# Patient Record
Sex: Female | Born: 1965
Health system: Southern US, Community
[De-identification: ages and names within clinical notes are randomized; demographics above are authoritative.]

## PROBLEM LIST (undated history)

## (undated) DIAGNOSIS — E039 Hypothyroidism, unspecified: Secondary | ICD-10-CM

## (undated) HISTORY — DX: Hypothyroidism, unspecified: E03.9

## (undated) HISTORY — PX: THYROIDECTOMY: SHX17

---

## 1997-11-18 ENCOUNTER — Encounter: Admission: RE | Admit: 1997-11-18 | Discharge: 1997-11-18 | Payer: Self-pay | Admitting: Family Medicine

## 1997-12-08 ENCOUNTER — Encounter: Admission: RE | Admit: 1997-12-08 | Discharge: 1997-12-08 | Payer: Self-pay | Admitting: Family Medicine

## 1997-12-14 ENCOUNTER — Encounter: Admission: RE | Admit: 1997-12-14 | Discharge: 1997-12-14 | Payer: Self-pay | Admitting: Family Medicine

## 1997-12-21 ENCOUNTER — Encounter: Admission: RE | Admit: 1997-12-21 | Discharge: 1997-12-21 | Payer: Self-pay | Admitting: Family Medicine

## 1998-01-04 ENCOUNTER — Encounter: Admission: RE | Admit: 1998-01-04 | Discharge: 1998-01-04 | Payer: Self-pay | Admitting: Family Medicine

## 1998-09-01 ENCOUNTER — Encounter: Admission: RE | Admit: 1998-09-01 | Discharge: 1998-09-01 | Payer: Self-pay | Admitting: Family Medicine

## 1998-09-07 ENCOUNTER — Ambulatory Visit (HOSPITAL_COMMUNITY): Admission: RE | Admit: 1998-09-07 | Discharge: 1998-09-07 | Payer: Self-pay | Admitting: *Deleted

## 1998-09-07 ENCOUNTER — Encounter: Admission: RE | Admit: 1998-09-07 | Discharge: 1998-09-07 | Payer: Self-pay | Admitting: Family Medicine

## 1998-09-15 ENCOUNTER — Encounter: Admission: RE | Admit: 1998-09-15 | Discharge: 1998-09-15 | Payer: Self-pay | Admitting: Family Medicine

## 1998-10-03 ENCOUNTER — Ambulatory Visit (HOSPITAL_COMMUNITY): Admission: RE | Admit: 1998-10-03 | Discharge: 1998-10-03 | Payer: Self-pay | Admitting: Family Medicine

## 1998-10-24 ENCOUNTER — Encounter: Admission: RE | Admit: 1998-10-24 | Discharge: 1998-10-24 | Payer: Self-pay | Admitting: Family Medicine

## 2000-02-09 ENCOUNTER — Encounter: Admission: RE | Admit: 2000-02-09 | Discharge: 2000-02-09 | Payer: Self-pay | Admitting: Family Medicine

## 2000-03-01 ENCOUNTER — Encounter: Admission: RE | Admit: 2000-03-01 | Discharge: 2000-03-01 | Payer: Self-pay | Admitting: Family Medicine

## 2000-11-29 ENCOUNTER — Encounter: Admission: RE | Admit: 2000-11-29 | Discharge: 2000-11-29 | Payer: Self-pay | Admitting: Family Medicine

## 2000-12-04 ENCOUNTER — Encounter: Admission: RE | Admit: 2000-12-04 | Discharge: 2000-12-04 | Payer: Self-pay | Admitting: Family Medicine

## 2001-02-19 ENCOUNTER — Encounter: Admission: RE | Admit: 2001-02-19 | Discharge: 2001-02-19 | Payer: Self-pay | Admitting: Family Medicine

## 2002-03-19 ENCOUNTER — Encounter: Admission: RE | Admit: 2002-03-19 | Discharge: 2002-03-19 | Payer: Self-pay | Admitting: Family Medicine

## 2003-06-23 ENCOUNTER — Encounter: Admission: RE | Admit: 2003-06-23 | Discharge: 2003-06-23 | Payer: Self-pay | Admitting: Family Medicine

## 2004-01-11 ENCOUNTER — Encounter: Admission: RE | Admit: 2004-01-11 | Discharge: 2004-01-11 | Payer: Self-pay | Admitting: Family Medicine

## 2004-05-09 ENCOUNTER — Other Ambulatory Visit: Admission: RE | Admit: 2004-05-09 | Discharge: 2004-05-09 | Payer: Self-pay | Admitting: Endocrinology

## 2004-05-09 ENCOUNTER — Ambulatory Visit: Payer: Self-pay | Admitting: Endocrinology

## 2004-05-16 ENCOUNTER — Ambulatory Visit: Payer: Self-pay | Admitting: Endocrinology

## 2004-05-22 ENCOUNTER — Encounter: Admission: RE | Admit: 2004-05-22 | Discharge: 2004-05-22 | Payer: Self-pay | Admitting: Endocrinology

## 2004-05-22 ENCOUNTER — Ambulatory Visit: Payer: Self-pay | Admitting: Endocrinology

## 2004-08-23 ENCOUNTER — Ambulatory Visit: Payer: Self-pay | Admitting: Endocrinology

## 2006-06-10 ENCOUNTER — Emergency Department (HOSPITAL_COMMUNITY): Admission: EM | Admit: 2006-06-10 | Discharge: 2006-06-10 | Payer: Self-pay | Admitting: Emergency Medicine

## 2011-08-31 ENCOUNTER — Other Ambulatory Visit (HOSPITAL_COMMUNITY): Payer: Self-pay | Admitting: Internal Medicine

## 2011-08-31 DIAGNOSIS — Z1231 Encounter for screening mammogram for malignant neoplasm of breast: Secondary | ICD-10-CM

## 2011-09-24 ENCOUNTER — Ambulatory Visit (HOSPITAL_COMMUNITY): Payer: Self-pay | Attending: Internal Medicine

## 2011-10-02 ENCOUNTER — Ambulatory Visit (HOSPITAL_COMMUNITY)
Admission: RE | Admit: 2011-10-02 | Discharge: 2011-10-02 | Disposition: A | Discharge status: Home / Self Care | Payer: Self-pay | Source: Ambulatory Visit | Attending: Internal Medicine | Admitting: Internal Medicine

## 2011-10-02 DIAGNOSIS — Z1231 Encounter for screening mammogram for malignant neoplasm of breast: Secondary | ICD-10-CM | POA: Insufficient documentation

## 2012-09-08 DIAGNOSIS — L905 Scar conditions and fibrosis of skin: Secondary | ICD-10-CM | POA: Insufficient documentation

## 2012-09-17 DIAGNOSIS — H60399 Other infective otitis externa, unspecified ear: Secondary | ICD-10-CM | POA: Insufficient documentation

## 2012-11-26 DIAGNOSIS — E042 Nontoxic multinodular goiter: Secondary | ICD-10-CM | POA: Insufficient documentation

## 2013-04-24 DIAGNOSIS — R001 Bradycardia, unspecified: Secondary | ICD-10-CM | POA: Insufficient documentation

## 2013-04-24 DIAGNOSIS — Z531 Procedure and treatment not carried out because of patient's decision for reasons of belief and group pressure: Secondary | ICD-10-CM | POA: Insufficient documentation

## 2013-08-03 DIAGNOSIS — E890 Postprocedural hypothyroidism: Secondary | ICD-10-CM | POA: Insufficient documentation

## 2013-09-03 DIAGNOSIS — R52 Pain, unspecified: Secondary | ICD-10-CM | POA: Insufficient documentation

## 2013-09-03 DIAGNOSIS — M76829 Posterior tibial tendinitis, unspecified leg: Secondary | ICD-10-CM | POA: Insufficient documentation

## 2016-11-30 DIAGNOSIS — E890 Postprocedural hypothyroidism: Secondary | ICD-10-CM | POA: Diagnosis not present

## 2016-11-30 DIAGNOSIS — E042 Nontoxic multinodular goiter: Secondary | ICD-10-CM | POA: Diagnosis not present

## 2016-11-30 DIAGNOSIS — R5383 Other fatigue: Secondary | ICD-10-CM | POA: Insufficient documentation

## 2017-03-21 DIAGNOSIS — R001 Bradycardia, unspecified: Secondary | ICD-10-CM | POA: Diagnosis not present

## 2017-03-21 DIAGNOSIS — I10 Essential (primary) hypertension: Secondary | ICD-10-CM | POA: Diagnosis not present

## 2017-03-21 DIAGNOSIS — R253 Fasciculation: Secondary | ICD-10-CM | POA: Diagnosis not present

## 2017-03-21 DIAGNOSIS — R002 Palpitations: Secondary | ICD-10-CM | POA: Diagnosis not present

## 2017-03-21 DIAGNOSIS — E039 Hypothyroidism, unspecified: Secondary | ICD-10-CM | POA: Diagnosis not present

## 2017-03-21 DIAGNOSIS — E0590 Thyrotoxicosis, unspecified without thyrotoxic crisis or storm: Secondary | ICD-10-CM | POA: Diagnosis not present

## 2017-04-05 DIAGNOSIS — E042 Nontoxic multinodular goiter: Secondary | ICD-10-CM | POA: Diagnosis not present

## 2017-04-05 DIAGNOSIS — R5383 Other fatigue: Secondary | ICD-10-CM | POA: Diagnosis not present

## 2017-04-05 DIAGNOSIS — Z0000 Encounter for general adult medical examination without abnormal findings: Secondary | ICD-10-CM | POA: Diagnosis not present

## 2017-04-05 DIAGNOSIS — Z0001 Encounter for general adult medical examination with abnormal findings: Secondary | ICD-10-CM | POA: Diagnosis not present

## 2017-04-05 DIAGNOSIS — Z6835 Body mass index (BMI) 35.0-35.9, adult: Secondary | ICD-10-CM | POA: Diagnosis not present

## 2017-04-05 DIAGNOSIS — I10 Essential (primary) hypertension: Secondary | ICD-10-CM | POA: Diagnosis not present

## 2017-04-05 DIAGNOSIS — E669 Obesity, unspecified: Secondary | ICD-10-CM | POA: Diagnosis not present

## 2017-04-23 DIAGNOSIS — J209 Acute bronchitis, unspecified: Secondary | ICD-10-CM | POA: Diagnosis not present

## 2017-04-23 DIAGNOSIS — J069 Acute upper respiratory infection, unspecified: Secondary | ICD-10-CM | POA: Diagnosis not present

## 2017-06-13 DIAGNOSIS — M7582 Other shoulder lesions, left shoulder: Secondary | ICD-10-CM | POA: Diagnosis not present

## 2017-06-21 DIAGNOSIS — M7501 Adhesive capsulitis of right shoulder: Secondary | ICD-10-CM | POA: Diagnosis not present

## 2017-08-02 DIAGNOSIS — R531 Weakness: Secondary | ICD-10-CM | POA: Diagnosis not present

## 2017-08-02 DIAGNOSIS — M25512 Pain in left shoulder: Secondary | ICD-10-CM | POA: Diagnosis not present

## 2017-08-02 DIAGNOSIS — M7502 Adhesive capsulitis of left shoulder: Secondary | ICD-10-CM | POA: Diagnosis not present

## 2017-08-02 DIAGNOSIS — M25612 Stiffness of left shoulder, not elsewhere classified: Secondary | ICD-10-CM | POA: Diagnosis not present

## 2017-08-08 DIAGNOSIS — Z23 Encounter for immunization: Secondary | ICD-10-CM | POA: Diagnosis not present

## 2017-08-23 DIAGNOSIS — M25571 Pain in right ankle and joints of right foot: Secondary | ICD-10-CM | POA: Diagnosis not present

## 2017-09-09 DIAGNOSIS — Z23 Encounter for immunization: Secondary | ICD-10-CM | POA: Diagnosis not present

## 2017-09-20 DIAGNOSIS — M25571 Pain in right ankle and joints of right foot: Secondary | ICD-10-CM | POA: Insufficient documentation

## 2017-09-20 DIAGNOSIS — S93491D Sprain of other ligament of right ankle, subsequent encounter: Secondary | ICD-10-CM | POA: Diagnosis not present

## 2017-10-04 DIAGNOSIS — I10 Essential (primary) hypertension: Secondary | ICD-10-CM | POA: Diagnosis not present

## 2017-10-04 DIAGNOSIS — E538 Deficiency of other specified B group vitamins: Secondary | ICD-10-CM | POA: Diagnosis not present

## 2017-10-04 DIAGNOSIS — R5383 Other fatigue: Secondary | ICD-10-CM | POA: Diagnosis not present

## 2017-11-06 DIAGNOSIS — E538 Deficiency of other specified B group vitamins: Secondary | ICD-10-CM | POA: Diagnosis not present

## 2017-12-16 DIAGNOSIS — E538 Deficiency of other specified B group vitamins: Secondary | ICD-10-CM | POA: Diagnosis not present

## 2018-01-21 DIAGNOSIS — E538 Deficiency of other specified B group vitamins: Secondary | ICD-10-CM | POA: Diagnosis not present

## 2018-02-07 DIAGNOSIS — R5383 Other fatigue: Secondary | ICD-10-CM | POA: Diagnosis not present

## 2018-02-07 DIAGNOSIS — E749 Disorder of carbohydrate metabolism, unspecified: Secondary | ICD-10-CM | POA: Diagnosis not present

## 2018-02-07 DIAGNOSIS — E890 Postprocedural hypothyroidism: Secondary | ICD-10-CM | POA: Diagnosis not present

## 2018-03-12 DIAGNOSIS — L821 Other seborrheic keratosis: Secondary | ICD-10-CM | POA: Diagnosis not present

## 2018-03-12 DIAGNOSIS — L700 Acne vulgaris: Secondary | ICD-10-CM | POA: Diagnosis not present

## 2018-03-12 DIAGNOSIS — L219 Seborrheic dermatitis, unspecified: Secondary | ICD-10-CM | POA: Diagnosis not present

## 2018-03-12 DIAGNOSIS — L819 Disorder of pigmentation, unspecified: Secondary | ICD-10-CM | POA: Diagnosis not present

## 2018-03-18 ENCOUNTER — Telehealth: Payer: Self-pay

## 2018-03-18 NOTE — Telephone Encounter (Signed)
Copied from CRM [id number]. Topic: Appointment Scheduling - New Patient >> Mar 17, 2018  3:17 PM Gaynelle AduPoole, Shalonda wrote: Selena BattenKim day is requesting to be a new patient for Dr.Lowne-chase. Her mother Nicole CellaDorothy Vieyra sees Dr.Lowne and she is hoping that since her mother referred her she can become a new patient. >> Mar 18, 2018  9:30 AM Gerrianne ScalePayne, Angela L wrote: Pt calling back to see about an appt with Dr Zola ButtonLowne-Chase for a NP appointment call her back at [phone] lmom if no answer

## 2018-03-18 NOTE — Telephone Encounter (Signed)
Please advise 

## 2018-03-18 NOTE — Telephone Encounter (Signed)
Okay to schedule NP appt at their convenience.  

## 2018-03-18 NOTE — Telephone Encounter (Signed)
yes

## 2018-03-19 NOTE — Telephone Encounter (Signed)
Appt scheduled 03/21/2018.

## 2018-03-21 ENCOUNTER — Ambulatory Visit (HOSPITAL_BASED_OUTPATIENT_CLINIC_OR_DEPARTMENT_OTHER)
Admission: RE | Admit: 2018-03-21 | Discharge: 2018-03-21 | Disposition: A | Discharge status: Home / Self Care | Payer: BLUE CROSS/BLUE SHIELD | Source: Ambulatory Visit | Attending: Family Medicine | Admitting: Family Medicine

## 2018-03-21 ENCOUNTER — Ambulatory Visit: Payer: BLUE CROSS/BLUE SHIELD | Admitting: Family Medicine

## 2018-03-21 ENCOUNTER — Encounter: Payer: Self-pay | Admitting: Family Medicine

## 2018-03-21 VITALS — BP 126/73 | HR 68 | Temp 98.1°F | Resp 16 | Ht 65.0 in | Wt 226.0 lb

## 2018-03-21 DIAGNOSIS — E890 Postprocedural hypothyroidism: Secondary | ICD-10-CM | POA: Diagnosis not present

## 2018-03-21 DIAGNOSIS — R0609 Other forms of dyspnea: Secondary | ICD-10-CM | POA: Diagnosis not present

## 2018-03-21 DIAGNOSIS — R600 Localized edema: Secondary | ICD-10-CM | POA: Diagnosis not present

## 2018-03-21 DIAGNOSIS — R5383 Other fatigue: Secondary | ICD-10-CM

## 2018-03-21 DIAGNOSIS — I10 Essential (primary) hypertension: Secondary | ICD-10-CM

## 2018-03-21 DIAGNOSIS — R0602 Shortness of breath: Secondary | ICD-10-CM | POA: Diagnosis not present

## 2018-03-21 MED ORDER — HYDROCHLOROTHIAZIDE 25 MG PO TABS: 25.0000 mg | ORAL_TABLET | Freq: Every day | ORAL | 3 refills | Status: DC

## 2018-03-21 NOTE — Patient Instructions (Signed)
DASH Eating Plan DASH stands for "Dietary Approaches to Stop Hypertension." The DASH eating plan is a healthy eating plan that has been shown to reduce high blood pressure (hypertension). It may also reduce your risk for type 2 diabetes, heart disease, and stroke. The DASH eating plan may also help with weight loss. What are tips for following this plan? General guidelines  Avoid eating more than 2,300 mg (milligrams) of salt (sodium) a day. If you have hypertension, you may need to reduce your sodium intake to 1,500 mg a day.  Limit alcohol intake to no more than 1 drink a day for nonpregnant women and 2 drinks a day for men. One drink equals 12 oz of beer, 5 oz of wine, or 1 oz of hard liquor.  Work with your health care provider to maintain a healthy body weight or to lose weight. Ask what an ideal weight is for you.  Get at least 30 minutes of exercise that causes your heart to beat faster (aerobic exercise) most [place] of the week. Activities may include walking, swimming, or biking.  Work with your health care provider or diet and nutrition specialist (dietitian) to adjust your eating plan to your individual calorie needs. Reading food labels  Check food labels for the amount of sodium per serving. Choose foods with less than 5 percent of the Daily Value of sodium. Generally, foods with less than 300 mg of sodium per serving fit into this eating plan.  To find whole grains, look for the word "whole" as the first word in the ingredient list. Shopping  Buy products labeled as "low-sodium" or "no salt added."  Buy fresh foods. Avoid canned foods and premade or frozen meals. Cooking  Avoid adding salt when cooking. Use salt-free seasonings or herbs instead of table salt or sea salt. Check with your health care provider or pharmacist before using salt substitutes.  Do not fry foods. Cook foods using healthy methods such as baking, boiling, grilling, and broiling instead.  Cook with  heart-healthy oils, such as olive, canola, soybean, or sunflower oil. Meal planning   Eat a balanced diet that includes: ? 5 or more servings of fruits and vegetables each day. At each meal, try to fill half of your plate with fruits and vegetables. ? Up to 6-8 servings of whole grains each day. ? Less than 6 oz of lean meat, poultry, or fish each day. A 3-oz serving of meat is about the same size as a deck of cards. One egg equals 1 oz. ? 2 servings of low-fat dairy each day. ? A serving of nuts, seeds, or beans 5 times each week. ? Heart-healthy fats. Healthy fats called Omega-3 fatty acids are found in foods such as flaxseeds and coldwater fish, like sardines, salmon, and mackerel.  Limit how much you eat of the following: ? Canned or prepackaged foods. ? Food that is high in trans fat, such as fried foods. ? Food that is high in saturated fat, such as fatty meat. ? Sweets, desserts, sugary drinks, and other foods with added sugar. ? Full-fat dairy products.  Do not salt foods before eating.  Try to eat at least 2 vegetarian meals each week.  Eat more home-cooked food and less restaurant, buffet, and fast food.  When eating at a restaurant, ask that your food be prepared with less salt or no salt, if possible. What foods are recommended? The items listed may not be a complete list. Talk with your dietitian about what   dietary choices are best for you. Grains Whole-grain or whole-wheat bread. Whole-grain or whole-wheat pasta. Brown rice. Oatmeal. Quinoa. Bulgur. Whole-grain and low-sodium cereals. Pita bread. Low-fat, low-sodium crackers. Whole-wheat flour tortillas. Vegetables Fresh or frozen vegetables (raw, steamed, roasted, or grilled). Low-sodium or reduced-sodium tomato and vegetable juice. Low-sodium or reduced-sodium tomato sauce and tomato paste. Low-sodium or reduced-sodium canned vegetables. Fruits All fresh, dried, or frozen fruit. Canned fruit in natural juice (without  added sugar). Meat and other protein foods Skinless chicken or turkey. Ground chicken or turkey. Pork with fat trimmed off. Fish and seafood. Egg whites. Dried beans, peas, or lentils. Unsalted nuts, nut butters, and seeds. Unsalted canned beans. Lean cuts of beef with fat trimmed off. Low-sodium, lean deli meat. Dairy Low-fat (1%) or fat-free (skim) milk. Fat-free, low-fat, or reduced-fat cheeses. Nonfat, low-sodium ricotta or cottage cheese. Low-fat or nonfat yogurt. Low-fat, low-sodium cheese. Fats and oils Soft margarine without trans fats. Vegetable oil. Low-fat, reduced-fat, or light mayonnaise and salad dressings (reduced-sodium). Canola, safflower, olive, soybean, and sunflower oils. Avocado. Seasoning and other foods Herbs. Spices. Seasoning mixes without salt. Unsalted popcorn and pretzels. Fat-free sweets. What foods are not recommended? The items listed may not be a complete list. Talk with your dietitian about what dietary choices are best for you. Grains Baked goods made with fat, such as croissants, muffins, or some breads. Dry pasta or rice meal packs. Vegetables Creamed or fried vegetables. Vegetables in a cheese sauce. Regular canned vegetables (not low-sodium or reduced-sodium). Regular canned tomato sauce and paste (not low-sodium or reduced-sodium). Regular tomato and vegetable juice (not low-sodium or reduced-sodium). Pickles. Olives. Fruits Canned fruit in a light or heavy syrup. Fried fruit. Fruit in cream or butter sauce. Meat and other protein foods Fatty cuts of meat. Ribs. Fried meat. Bacon. Sausage. Bologna and other processed lunch meats. Salami. Fatback. Hotdogs. Bratwurst. Salted nuts and seeds. Canned beans with added salt. Canned or smoked fish. Whole eggs or egg yolks. Chicken or turkey with skin. Dairy Whole or 2% milk, cream, and half-and-half. Whole or full-fat cream cheese. Whole-fat or sweetened yogurt. Full-fat cheese. Nondairy creamers. Whipped toppings.  Processed cheese and cheese spreads. Fats and oils Butter. Stick margarine. Lard. Shortening. Ghee. Bacon fat. Tropical oils, such as coconut, palm kernel, or palm oil. Seasoning and other foods Salted popcorn and pretzels. Onion salt, garlic salt, seasoned salt, table salt, and sea salt. Worcestershire sauce. Tartar sauce. Barbecue sauce. Teriyaki sauce. Soy sauce, including reduced-sodium. Steak sauce. Canned and packaged gravies. Fish sauce. Oyster sauce. Cocktail sauce. Horseradish that you find on the shelf. Ketchup. Mustard. Meat flavorings and tenderizers. Bouillon cubes. Hot sauce and Tabasco sauce. Premade or packaged marinades. Premade or packaged taco seasonings. Relishes. Regular salad dressings. Where to find more information:  National Heart, Lung, and Blood Institute: www.nhlbi.nih.gov  American Heart Association: www.heart.org Summary  The DASH eating plan is a healthy eating plan that has been shown to reduce high blood pressure (hypertension). It may also reduce your risk for type 2 diabetes, heart disease, and stroke.  With the DASH eating plan, you should limit salt (sodium) intake to 2,300 mg a day. If you have hypertension, you may need to reduce your sodium intake to 1,500 mg a day.  When on the DASH eating plan, aim to eat more fresh fruits and vegetables, whole grains, lean proteins, low-fat dairy, and heart-healthy fats.  Work with your health care provider or diet and nutrition specialist (dietitian) to adjust your eating plan to your individual   calorie needs. This information is not intended to replace advice given to you by your health care provider. Make sure you discuss any questions you have with your health care provider. Document Released: 05/31/2011 Document Revised: 06/04/2016 Document Reviewed: 06/04/2016 Elsevier Interactive Patient Education  2018 Elsevier Inc.  

## 2018-03-21 NOTE — Progress Notes (Signed)
Patient ID: Lauren Jones, female    DOB: 1965-08-07  Age: 52 y.o. MRN: [id number]    Subjective:  Subjective  HPI Lauren Jones presents to establish and c/o low ext edema since her thyroid was removed in 2014   Worse in L low ext.  Pt also c/o sob , no chest pain   Review of Systems  Constitutional: Negative for appetite change, diaphoresis, fatigue and unexpected weight change.  Eyes: Negative for pain, redness and visual disturbance.  Respiratory: Positive for shortness of breath. Negative for cough, chest tightness and wheezing.   Cardiovascular: Positive for leg swelling. Negative for chest pain and palpitations.  Endocrine: Negative for cold intolerance, heat intolerance, polydipsia, polyphagia and polyuria.  Genitourinary: Negative for difficulty urinating, dysuria and frequency.  Neurological: Negative for dizziness, light-headedness, numbness and headaches.    History Past Medical History:  Diagnosis Date  . Hypothyroidism     She has a past surgical history that includes Thyroidectomy (Bilateral, 2014-2015?).   Her family history includes Colon cancer in her paternal grandmother; Hypertension in her father and mother; Sarcoidosis in her father.She reports that she has quit smoking. Her smoking use included cigarettes. She has never used smokeless tobacco. She reports that she drinks alcohol. She reports that she does not use drugs.  No current outpatient medications on file prior to visit.   No current facility-administered medications on file prior to visit.      Objective:  Objective  Physical Exam  Constitutional: She is oriented to person, place, and time. She appears well-developed and well-nourished.  HENT:  Head: Normocephalic and atraumatic.  Eyes: Conjunctivae and EOM are normal.  Neck: Normal range of motion. Neck supple. No JVD present. Carotid bruit is not present. No thyromegaly present.  Cardiovascular: Normal rate, regular rhythm and normal heart sounds.  No  murmur heard. Pulmonary/Chest: Effort normal and breath sounds normal. No respiratory distress. She has no wheezes. She has no rales. She exhibits no tenderness.  Musculoskeletal: She exhibits no edema.  Neurological: She is alert and oriented to person, place, and time.  Psychiatric: She has a normal mood and affect.  Nursing note and vitals reviewed.  BP 126/73 (BP Location: Left Arm, Cuff Size: Large)   Pulse 68   Temp 98.1 F (36.7 C) (Oral)   Resp 16   Ht 5\' 5"  (1.651 m)   Wt 226 lb (102.5 kg)   SpO2 98%   BMI 37.61 kg/m  Wt Readings from Last 3 Encounters:  03/21/18 226 lb (102.5 kg)     Lab Results  Component Value Date   WBC 6.3 03/24/2018   HGB 11.6 (L) 03/24/2018   HCT 35.8 03/24/2018   PLT 315 03/24/2018   GLUCOSE 99 03/24/2018   CHOL 168 03/24/2018   TRIG 119 03/24/2018   HDL 43 (L) 03/24/2018   LDLCALC 103 (H) 03/24/2018   ALT 14 03/24/2018   AST 16 03/24/2018   NA 140 03/24/2018   K 4.0 03/24/2018   CL 103 03/24/2018   CREATININE 0.93 03/24/2018   BUN 9 03/24/2018   CO2 30 03/24/2018   TSH 5.54 (H) 03/24/2018   HGBA1C 5.8 (H) 03/24/2018    Ms Digital Screening  Result Date: 10/03/2011 DG [facility] MAMMOGRAM Bilateral CC and MLO view(s) were taken. Technologist: Rebecca Eaton, R.T.(R)(M) DIGITAL SCREENING MAMMOGRAM WITH CAD: There are scattered fibroglandular densities.  No masses or malignant type calcifications are identified. Images were processed with CAD. IMPRESSION: No specific mammographic evidence of malignancy.  Next  screening mammogram is recommended in one year. A result letter of this screening mammogram will be mailed directly to the patient. ASSESSMENT: Negative - BI-RADS 1 Screening mammogram in 1 year. ,    Assessment & Plan:  Plan  I am having Lauren Jones start on hydrochlorothiazide.  Meds ordered this encounter  Medications  . hydrochlorothiazide (HYDRODIURIL) 25 MG tablet    Sig: Take 1 tablet (25 mg total) by mouth daily.     Dispense:  90 tablet    Refill:  3    Problem List Items Addressed This Visit      Unprioritized   Essential hypertension    Poorly controlled will alter medications, encouraged DASH diet, minimize caffeine and obtain adequate sleep. Report concerning symptoms and follow up as directed and as needed       Relevant Medications   hydrochlorothiazide (HYDRODIURIL) 25 MG tablet   Fatigue   Relevant Orders   Lipid panel (Completed)   CBC with Differential/Platelet (Completed)   Hemoglobin A1c (Completed)   Vitamin B12 (Completed)   Thyroid Panel With TSH (Completed)   Vitamin D (25 hydroxy) (Completed)   Comprehensive metabolic panel (Completed)   Lower extremity edema    Elevate legs Diuretic per orders       Relevant Medications   hydrochlorothiazide (HYDRODIURIL) 25 MG tablet   Other Relevant Orders   EKG 12-Lead (Completed)   ECHOCARDIOGRAM COMPLETE   DG Chest 2 View (Completed)   Lipid panel (Completed)   CBC with Differential/Platelet (Completed)   Hemoglobin A1c (Completed)   Vitamin B12 (Completed)   Thyroid Panel With TSH (Completed)   Vitamin D (25 hydroxy) (Completed)   Comprehensive metabolic panel (Completed)    Other Visit Diagnoses    Postoperative hypothyroidism    -  Primary   Relevant Orders   Thyroid Panel With TSH (Completed)   DOE (dyspnea on exertion)       Relevant Orders   EKG 12-Lead (Completed)   ECHOCARDIOGRAM COMPLETE   DG Chest 2 View (Completed)   Lipid panel (Completed)   CBC with Differential/Platelet (Completed)   Hemoglobin A1c (Completed)   Vitamin B12 (Completed)   Thyroid Panel With TSH (Completed)   Vitamin D (25 hydroxy) (Completed)   Comprehensive metabolic panel (Completed)      Follow-up: Return in about 3 weeks (around 04/11/2018) for hypertension.  Donato Schultz, DO

## 2018-03-24 ENCOUNTER — Other Ambulatory Visit (INDEPENDENT_AMBULATORY_CARE_PROVIDER_SITE_OTHER): Payer: BLUE CROSS/BLUE SHIELD

## 2018-03-24 ENCOUNTER — Other Ambulatory Visit: Payer: BLUE CROSS/BLUE SHIELD

## 2018-03-24 DIAGNOSIS — E890 Postprocedural hypothyroidism: Secondary | ICD-10-CM | POA: Diagnosis not present

## 2018-03-24 DIAGNOSIS — R5383 Other fatigue: Secondary | ICD-10-CM | POA: Diagnosis not present

## 2018-03-24 DIAGNOSIS — R600 Localized edema: Secondary | ICD-10-CM | POA: Diagnosis not present

## 2018-03-24 DIAGNOSIS — R0609 Other forms of dyspnea: Secondary | ICD-10-CM | POA: Diagnosis not present

## 2018-03-25 ENCOUNTER — Encounter: Payer: Self-pay | Admitting: Family Medicine

## 2018-03-25 DIAGNOSIS — R600 Localized edema: Secondary | ICD-10-CM | POA: Insufficient documentation

## 2018-03-25 DIAGNOSIS — I10 Essential (primary) hypertension: Secondary | ICD-10-CM | POA: Insufficient documentation

## 2018-03-25 LAB — CBC WITH DIFFERENTIAL/PLATELET
BASOS ABS: 69 {cells}/uL (ref 0–200)
Basophils Relative: 1.1 %
EOS PCT: 3 %
Eosinophils Absolute: 189 cells/uL (ref 15–500)
HCT: 35.8 % (ref 35.0–45.0)
HEMOGLOBIN: 11.6 g/dL — AB (ref 11.7–15.5)
Lymphs Abs: 2255 cells/uL (ref 850–3900)
MCH: 26.2 pg — ABNORMAL LOW (ref 27.0–33.0)
MCHC: 32.4 g/dL (ref 32.0–36.0)
MCV: 81 fL (ref 80.0–100.0)
MPV: 10.8 fL (ref 7.5–12.5)
Monocytes Relative: 9.4 %
NEUTROS ABS: 3194 {cells}/uL (ref 1500–7800)
Neutrophils Relative %: 50.7 %
Platelets: 315 10*3/uL (ref 140–400)
RBC: 4.42 10*6/uL (ref 3.80–5.10)
RDW: 14.5 % (ref 11.0–15.0)
Total Lymphocyte: 35.8 %
WBC mixed population: 592 cells/uL (ref 200–950)
WBC: 6.3 10*3/uL (ref 3.8–10.8)

## 2018-03-25 LAB — LIPID PANEL
CHOLESTEROL: 168 mg/dL (ref <200)
HDL: 43 mg/dL — AB (ref >50)
LDL Cholesterol (Calc): 103 mg/dL (calc) — ABNORMAL HIGH
Non-HDL Cholesterol (Calc): 125 mg/dL (calc) (ref <130)
Total CHOL/HDL Ratio: 3.9 (calc) (ref <5.0)
Triglycerides: 119 mg/dL (ref <150)

## 2018-03-25 LAB — COMPREHENSIVE METABOLIC PANEL
AG RATIO: 1.4 (calc) (ref 1.0–2.5)
ALKALINE PHOSPHATASE (APISO): 66 U/L (ref 33–130)
ALT: 14 U/L (ref 6–29)
AST: 16 U/L (ref 10–35)
Albumin: 3.8 g/dL (ref 3.6–5.1)
BILIRUBIN TOTAL: 0.5 mg/dL (ref 0.2–1.2)
BUN: 9 mg/dL (ref 7–25)
CALCIUM: 8.7 mg/dL (ref 8.6–10.4)
CO2: 30 mmol/L (ref 20–32)
Chloride: 103 mmol/L (ref 98–110)
Creat: 0.93 mg/dL (ref 0.50–1.05)
Globulin: 2.7 g/dL (calc) (ref 1.9–3.7)
Glucose, Bld: 99 mg/dL (ref 65–99)
Potassium: 4 mmol/L (ref 3.5–5.3)
Sodium: 140 mmol/L (ref 135–146)
Total Protein: 6.5 g/dL (ref 6.1–8.1)

## 2018-03-25 LAB — THYROID PANEL WITH TSH
Free Thyroxine Index: 2.7 (ref 1.4–3.8)
T3 UPTAKE: 26 % (ref 22–35)
T4 TOTAL: 10.5 ug/dL (ref 5.1–11.9)
TSH: 5.54 mIU/L — ABNORMAL HIGH

## 2018-03-25 LAB — VITAMIN D 25 HYDROXY (VIT D DEFICIENCY, FRACTURES): VIT D 25 HYDROXY: 14 ng/mL — AB (ref 30–100)

## 2018-03-25 LAB — HEMOGLOBIN A1C
Hgb A1c MFr Bld: 5.8 % of total Hgb — ABNORMAL HIGH (ref <5.7)
Mean Plasma Glucose: 120 (calc)
eAG (mmol/L): 6.6 (calc)

## 2018-03-25 LAB — VITAMIN B12: VITAMIN B 12: 286 pg/mL (ref 200–1100)

## 2018-03-25 NOTE — Assessment & Plan Note (Signed)
Elevate legs Diuretic per orders

## 2018-03-25 NOTE — Assessment & Plan Note (Signed)
Poorly controlled will alter medications, encouraged DASH diet, minimize caffeine and obtain adequate sleep. Report concerning symptoms and follow up as directed and as needed 

## 2018-04-01 ENCOUNTER — Ambulatory Visit (HOSPITAL_BASED_OUTPATIENT_CLINIC_OR_DEPARTMENT_OTHER)
Admission: RE | Admit: 2018-04-01 | Discharge: 2018-04-01 | Disposition: A | Discharge status: Home / Self Care | Payer: BLUE CROSS/BLUE SHIELD | Source: Ambulatory Visit | Attending: Family Medicine | Admitting: Family Medicine

## 2018-04-01 DIAGNOSIS — R0609 Other forms of dyspnea: Secondary | ICD-10-CM

## 2018-04-01 DIAGNOSIS — R600 Localized edema: Secondary | ICD-10-CM

## 2018-04-02 ENCOUNTER — Other Ambulatory Visit: Payer: Self-pay | Admitting: Family Medicine

## 2018-04-02 ENCOUNTER — Encounter: Payer: Self-pay | Admitting: *Deleted

## 2018-04-02 ENCOUNTER — Other Ambulatory Visit: Payer: Self-pay | Admitting: *Deleted

## 2018-04-02 ENCOUNTER — Telehealth: Payer: Self-pay | Admitting: *Deleted

## 2018-04-02 DIAGNOSIS — M62838 Other muscle spasm: Secondary | ICD-10-CM

## 2018-04-02 MED ORDER — CYCLOBENZAPRINE HCL 10 MG PO TABS: 10.0000 mg | ORAL_TABLET | Freq: Every day | ORAL | 1 refills | Status: DC

## 2018-04-02 MED ORDER — VITAMIN D (ERGOCALCIFEROL) 1.25 MG (50000 UNIT) PO CAPS: 50000.0000 [IU] | ORAL_CAPSULE | ORAL | 1 refills | Status: DC

## 2018-04-02 NOTE — Telephone Encounter (Signed)
Called patient about her lab results and she asked for a refill on her cyclobenzaprine for shoulder tension from typing and doing her school work.

## 2018-04-02 NOTE — Telephone Encounter (Signed)
Sent in

## 2018-04-03 ENCOUNTER — Other Ambulatory Visit: Payer: Self-pay | Admitting: *Deleted

## 2018-04-03 ENCOUNTER — Telehealth: Payer: Self-pay | Admitting: *Deleted

## 2018-04-03 ENCOUNTER — Encounter: Payer: Self-pay | Admitting: *Deleted

## 2018-04-03 MED ORDER — VITAMIN D3 25 MCG (1000 UT) PO CAPS: 1.0000 | ORAL_CAPSULE | Freq: Every day | ORAL | 1 refills | Status: DC

## 2018-04-03 NOTE — Telephone Encounter (Signed)
Received Medical records from [facility]; forwarded to provider/SLS 10/10

## 2018-04-03 NOTE — Telephone Encounter (Signed)
Patient notified

## 2018-04-11 ENCOUNTER — Ambulatory Visit: Payer: BLUE CROSS/BLUE SHIELD | Admitting: Family Medicine

## 2018-04-11 ENCOUNTER — Ambulatory Visit (HOSPITAL_BASED_OUTPATIENT_CLINIC_OR_DEPARTMENT_OTHER): Admission: RE | Admit: 2018-04-11 | Payer: BLUE CROSS/BLUE SHIELD | Source: Ambulatory Visit

## 2018-04-11 ENCOUNTER — Encounter: Payer: Self-pay | Admitting: Family Medicine

## 2018-04-11 VITALS — BP 120/76 | HR 85 | Temp 98.6°F | Resp 16 | Ht 65.0 in | Wt 230.4 lb

## 2018-04-11 DIAGNOSIS — E538 Deficiency of other specified B group vitamins: Secondary | ICD-10-CM | POA: Diagnosis not present

## 2018-04-11 DIAGNOSIS — R600 Localized edema: Secondary | ICD-10-CM

## 2018-04-11 DIAGNOSIS — E559 Vitamin D deficiency, unspecified: Secondary | ICD-10-CM

## 2018-04-11 DIAGNOSIS — M25572 Pain in left ankle and joints of left foot: Secondary | ICD-10-CM

## 2018-04-11 DIAGNOSIS — I10 Essential (primary) hypertension: Secondary | ICD-10-CM

## 2018-04-11 DIAGNOSIS — E890 Postprocedural hypothyroidism: Secondary | ICD-10-CM

## 2018-04-11 MED ORDER — HYDROCHLOROTHIAZIDE 25 MG PO TABS: 25.0000 mg | ORAL_TABLET | Freq: Every day | ORAL | 3 refills | Status: DC

## 2018-04-11 MED ORDER — CYANOCOBALAMIN 1000 MCG/ML IJ SOLN
1000.0000 ug | Freq: Once | INTRAMUSCULAR | Status: AC
  Administered 2018-04-11: 1000 ug via INTRAMUSCULAR

## 2018-04-11 MED ORDER — CYANOCOBALAMIN 1000 MCG/ML IJ SOLN: 1000.0000 ug | Freq: Once | INTRAMUSCULAR | 0 refills | Status: AC

## 2018-04-11 NOTE — Patient Instructions (Signed)
DASH Eating Plan DASH stands for "Dietary Approaches to Stop Hypertension." The DASH eating plan is a healthy eating plan that has been shown to reduce high blood pressure (hypertension). It may also reduce your risk for type 2 diabetes, heart disease, and stroke. The DASH eating plan may also help with weight loss. What are tips for following this plan? General guidelines  Avoid eating more than 2,300 mg (milligrams) of salt (sodium) a day. If you have hypertension, you may need to reduce your sodium intake to 1,500 mg a day.  Limit alcohol intake to no more than 1 drink a day for nonpregnant women and 2 drinks a day for men. One drink equals 12 oz of beer, 5 oz of wine, or 1 oz of hard liquor.  Work with your health care provider to maintain a healthy body weight or to lose weight. Ask what an ideal weight is for you.  Get at least 30 minutes of exercise that causes your heart to beat faster (aerobic exercise) most [place] of the week. Activities may include walking, swimming, or biking.  Work with your health care provider or diet and nutrition specialist (dietitian) to adjust your eating plan to your individual calorie needs. Reading food labels  Check food labels for the amount of sodium per serving. Choose foods with less than 5 percent of the Daily Value of sodium. Generally, foods with less than 300 mg of sodium per serving fit into this eating plan.  To find whole grains, look for the word "whole" as the first word in the ingredient list. Shopping  Buy products labeled as "low-sodium" or "no salt added."  Buy fresh foods. Avoid canned foods and premade or frozen meals. Cooking  Avoid adding salt when cooking. Use salt-free seasonings or herbs instead of table salt or sea salt. Check with your health care provider or pharmacist before using salt substitutes.  Do not fry foods. Cook foods using healthy methods such as baking, boiling, grilling, and broiling instead.  Cook with  heart-healthy oils, such as olive, canola, soybean, or sunflower oil. Meal planning   Eat a balanced diet that includes: ? 5 or more servings of fruits and vegetables each day. At each meal, try to fill half of your plate with fruits and vegetables. ? Up to 6-8 servings of whole grains each day. ? Less than 6 oz of lean meat, poultry, or fish each day. A 3-oz serving of meat is about the same size as a deck of cards. One egg equals 1 oz. ? 2 servings of low-fat dairy each day. ? A serving of nuts, seeds, or beans 5 times each week. ? Heart-healthy fats. Healthy fats called Omega-3 fatty acids are found in foods such as flaxseeds and coldwater fish, like sardines, salmon, and mackerel.  Limit how much you eat of the following: ? Canned or prepackaged foods. ? Food that is high in trans fat, such as fried foods. ? Food that is high in saturated fat, such as fatty meat. ? Sweets, desserts, sugary drinks, and other foods with added sugar. ? Full-fat dairy products.  Do not salt foods before eating.  Try to eat at least 2 vegetarian meals each week.  Eat more home-cooked food and less restaurant, buffet, and fast food.  When eating at a restaurant, ask that your food be prepared with less salt or no salt, if possible. What foods are recommended? The items listed may not be a complete list. Talk with your dietitian about what   dietary choices are best for you. Grains Whole-grain or whole-wheat bread. Whole-grain or whole-wheat pasta. Brown rice. Oatmeal. Quinoa. Bulgur. Whole-grain and low-sodium cereals. Pita bread. Low-fat, low-sodium crackers. Whole-wheat flour tortillas. Vegetables Fresh or frozen vegetables (raw, steamed, roasted, or grilled). Low-sodium or reduced-sodium tomato and vegetable juice. Low-sodium or reduced-sodium tomato sauce and tomato paste. Low-sodium or reduced-sodium canned vegetables. Fruits All fresh, dried, or frozen fruit. Canned fruit in natural juice (without  added sugar). Meat and other protein foods Skinless chicken or turkey. Ground chicken or turkey. Pork with fat trimmed off. Fish and seafood. Egg whites. Dried beans, peas, or lentils. Unsalted nuts, nut butters, and seeds. Unsalted canned beans. Lean cuts of beef with fat trimmed off. Low-sodium, lean deli meat. Dairy Low-fat (1%) or fat-free (skim) milk. Fat-free, low-fat, or reduced-fat cheeses. Nonfat, low-sodium ricotta or cottage cheese. Low-fat or nonfat yogurt. Low-fat, low-sodium cheese. Fats and oils Soft margarine without trans fats. Vegetable oil. Low-fat, reduced-fat, or light mayonnaise and salad dressings (reduced-sodium). Canola, safflower, olive, soybean, and sunflower oils. Avocado. Seasoning and other foods Herbs. Spices. Seasoning mixes without salt. Unsalted popcorn and pretzels. Fat-free sweets. What foods are not recommended? The items listed may not be a complete list. Talk with your dietitian about what dietary choices are best for you. Grains Baked goods made with fat, such as croissants, muffins, or some breads. Dry pasta or rice meal packs. Vegetables Creamed or fried vegetables. Vegetables in a cheese sauce. Regular canned vegetables (not low-sodium or reduced-sodium). Regular canned tomato sauce and paste (not low-sodium or reduced-sodium). Regular tomato and vegetable juice (not low-sodium or reduced-sodium). Pickles. Olives. Fruits Canned fruit in a light or heavy syrup. Fried fruit. Fruit in cream or butter sauce. Meat and other protein foods Fatty cuts of meat. Ribs. Fried meat. Bacon. Sausage. Bologna and other processed lunch meats. Salami. Fatback. Hotdogs. Bratwurst. Salted nuts and seeds. Canned beans with added salt. Canned or smoked fish. Whole eggs or egg yolks. Chicken or turkey with skin. Dairy Whole or 2% milk, cream, and half-and-half. Whole or full-fat cream cheese. Whole-fat or sweetened yogurt. Full-fat cheese. Nondairy creamers. Whipped toppings.  Processed cheese and cheese spreads. Fats and oils Butter. Stick margarine. Lard. Shortening. Ghee. Bacon fat. Tropical oils, such as coconut, palm kernel, or palm oil. Seasoning and other foods Salted popcorn and pretzels. Onion salt, garlic salt, seasoned salt, table salt, and sea salt. Worcestershire sauce. Tartar sauce. Barbecue sauce. Teriyaki sauce. Soy sauce, including reduced-sodium. Steak sauce. Canned and packaged gravies. Fish sauce. Oyster sauce. Cocktail sauce. Horseradish that you find on the shelf. Ketchup. Mustard. Meat flavorings and tenderizers. Bouillon cubes. Hot sauce and Tabasco sauce. Premade or packaged marinades. Premade or packaged taco seasonings. Relishes. Regular salad dressings. Where to find more information:  National Heart, Lung, and Blood Institute: www.nhlbi.nih.gov  American Heart Association: www.heart.org Summary  The DASH eating plan is a healthy eating plan that has been shown to reduce high blood pressure (hypertension). It may also reduce your risk for type 2 diabetes, heart disease, and stroke.  With the DASH eating plan, you should limit salt (sodium) intake to 2,300 mg a day. If you have hypertension, you may need to reduce your sodium intake to 1,500 mg a day.  When on the DASH eating plan, aim to eat more fresh fruits and vegetables, whole grains, lean proteins, low-fat dairy, and heart-healthy fats.  Work with your health care provider or diet and nutrition specialist (dietitian) to adjust your eating plan to your individual   calorie needs. This information is not intended to replace advice given to you by your health care provider. Make sure you discuss any questions you have with your health care provider. Document Released: 05/31/2011 Document Revised: 06/04/2016 Document Reviewed: 06/04/2016 Elsevier Interactive Patient Education  2018 Else[employer] Inc.  

## 2018-04-11 NOTE — Progress Notes (Signed)
Patient ID: Lauren Jones, female    DOB: Feb 07, 1966  Age: 52 y.o. MRN: [id number]    Subjective:  Subjective  HPI Valor Degrasse presents for f/u bp , swelling low ext   Review of Systems  Constitutional: Negative for chills and fever.  HENT: Negative for congestion and hearing loss.   Eyes: Negative for discharge.  Respiratory: Negative for cough and shortness of breath.   Cardiovascular: Negative for chest pain, palpitations and leg swelling.  Gastrointestinal: Negative for abdominal pain, blood in stool, constipation, diarrhea, nausea and vomiting.  Genitourinary: Negative for dysuria, frequency, hematuria and urgency.  Musculoskeletal: Negative for back pain and myalgias.  Skin: Negative for rash.  Allergic/Immunologic: Negative for environmental allergies.  Neurological: Negative for dizziness, weakness and headaches.  Hematological: Does not bruise/bleed easily.  Psychiatric/Behavioral: Negative for suicidal ideas. The patient is not nervous/anxious.     History Past Medical History:  Diagnosis Date  . Hypothyroidism     She has a past surgical history that includes Thyroidectomy (Bilateral, 2014-2015?).   Her family history includes Colon cancer in her paternal grandmother; Hypertension in her father and mother; Sarcoidosis in her father.She reports that she has quit smoking. Her smoking use included cigarettes. She has never used smokeless tobacco. She reports that she drinks alcohol. She reports that she does not use drugs.  Current Outpatient Medications on File Prior to Visit  Medication Sig Dispense Refill  . Cholecalciferol (VITAMIN D3) 1000 units CAPS Take 1 capsule (1,000 Units total) by mouth daily. 90 capsule 1  . cyclobenzaprine (FLEXERIL) 10 MG tablet Take 1 tablet (10 mg total) by mouth daily. 30 tablet 1  . levothyroxine (SYNTHROID) 175 MCG tablet Take 1 tablet by mouth daily.    . Vitamin D, Ergocalciferol, (DRISDOL) 50000 units CAPS capsule Take 1 capsule (50,000  Units total) by mouth every 7 (seven) [place]. 12 capsule 1   No current facility-administered medications on file prior to visit.      Objective:  Objective  Physical Exam  Constitutional: She is oriented to person, place, and time. She appears well-developed and well-nourished.  HENT:  Head: Normocephalic and atraumatic.  Eyes: Conjunctivae and EOM are normal.  Neck: Normal range of motion. Neck supple. No JVD present. Carotid bruit is not present. No thyromegaly present.  Cardiovascular: Normal rate, regular rhythm and normal heart sounds.  No murmur heard. Pulmonary/Chest: Effort normal and breath sounds normal. No respiratory distress. She has no wheezes. She has no rales. She exhibits no tenderness.  Musculoskeletal: She exhibits no edema.  Neurological: She is alert and oriented to person, place, and time.  Psychiatric: She has a normal mood and affect.  Nursing note and vitals reviewed.  BP 120/76 (BP Location: Right Arm, Cuff Size: Large)   Pulse 85   Temp 98.6 F (37 C) (Oral)   Resp 16   Ht 5\' 5"  (1.651 m)   Wt 230 lb 6.4 oz (104.5 kg)   SpO2 98%   BMI 38.34 kg/m  Wt Readings from Last 3 Encounters:  04/11/18 230 lb 6.4 oz (104.5 kg)  03/21/18 226 lb (102.5 kg)     Lab Results  Component Value Date   WBC 6.3 03/24/2018   HGB 11.6 (L) 03/24/2018   HCT 35.8 03/24/2018   PLT 315 03/24/2018   GLUCOSE 99 03/24/2018   CHOL 168 03/24/2018   TRIG 119 03/24/2018   HDL 43 (L) 03/24/2018   LDLCALC 103 (H) 03/24/2018   ALT 14 03/24/2018   AST  16 03/24/2018   NA 140 03/24/2018   K 4.0 03/24/2018   CL 103 03/24/2018   CREATININE 0.93 03/24/2018   BUN 9 03/24/2018   CO2 30 03/24/2018   TSH 5.54 (H) 03/24/2018   HGBA1C 5.8 (H) 03/24/2018    No results found.   Assessment & Plan:  Plan  I am having Cassey Berrett start on cyanocobalamin. I am also having her maintain her levothyroxine, cyclobenzaprine, Vitamin D (Ergocalciferol), Vitamin D3, and hydrochlorothiazide.  We administered cyanocobalamin.  Meds ordered this encounter  Medications  . cyanocobalamin (,VITAMIN B-12,) 1000 MCG/ML injection    Sig: Inject 1 mL (1,000 mcg total) into the muscle once for 1 dose.    Dispense:  1 mL    Refill:  0  . hydrochlorothiazide (HYDRODIURIL) 25 MG tablet    Sig: Take 1 tablet (25 mg total) by mouth daily.    Dispense:  90 tablet    Refill:  3  . cyanocobalamin ((VITAMIN B-12)) injection 1,000 mcg    Problem List Items Addressed This Visit      Unprioritized   Essential hypertension    Well controlled, no changes to meds. Encouraged heart healthy diet such as the DASH diet and exercise as tolerated.       Relevant Medications   hydrochlorothiazide (HYDRODIURIL) 25 MG tablet   Lower extremity edema    Elevate legs Compression stockings/ socks hctz  Watch salt        Relevant Medications   hydrochlorothiazide (HYDRODIURIL) 25 MG tablet   Vitamin B 12 deficiency - Primary    Check labs con't b12 injections      Relevant Medications   cyanocobalamin ((VITAMIN B-12)) injection 1,000 mcg (Completed)   Other Relevant Orders   Vitamin B12    Other Visit Diagnoses    Postoperative hypothyroidism       Relevant Orders   Ambulatory referral to Endocrinology   Morbid obesity (HCC)       Relevant Orders   Amb Ref to Medical Weight Management   Vitamin D deficiency       Relevant Orders   Vitamin D 1,25 dihydroxy   Left ankle pain, unspecified chronicity       Relevant Orders   Comprehensive metabolic panel   Uric acid      Follow-up: Return in about 6 months (around 10/11/2018), or if symptoms worsen or fail to improve.  Donato Schultz, DO

## 2018-04-13 DIAGNOSIS — E538 Deficiency of other specified B group vitamins: Secondary | ICD-10-CM | POA: Insufficient documentation

## 2018-04-13 NOTE — Assessment & Plan Note (Signed)
Check labs con't b12 injections

## 2018-04-13 NOTE — Assessment & Plan Note (Signed)
Well controlled, no changes to meds. Encouraged heart healthy diet such as the DASH diet and exercise as tolerated.  °

## 2018-04-13 NOTE — Assessment & Plan Note (Signed)
Elevate legs Compression stockings/ socks hctz  Watch salt

## 2018-05-07 DIAGNOSIS — E039 Hypothyroidism, unspecified: Secondary | ICD-10-CM | POA: Diagnosis not present

## 2018-05-07 DIAGNOSIS — E890 Postprocedural hypothyroidism: Secondary | ICD-10-CM | POA: Diagnosis not present

## 2018-05-14 ENCOUNTER — Ambulatory Visit (INDEPENDENT_AMBULATORY_CARE_PROVIDER_SITE_OTHER): Payer: BLUE CROSS/BLUE SHIELD

## 2018-05-14 DIAGNOSIS — E538 Deficiency of other specified B group vitamins: Secondary | ICD-10-CM

## 2018-05-14 MED ORDER — CYANOCOBALAMIN 1000 MCG/ML IJ SOLN
1000.0000 ug | Freq: Once | INTRAMUSCULAR | Status: AC
  Administered 2018-05-14: 1000 ug via INTRAMUSCULAR

## 2018-05-14 NOTE — Progress Notes (Addendum)
Pre visit review using our clinic tool,if applicable. No additional management support is needed unless otherwise documented below in the visit note.   Pt here for monthly B12 injection per order from Dr. Seabron SpatesYvonne Lowne-Chase.  B12 1000mcg given IM right deltoid, and pt tolerated injection well.  Next B12 injection scheduled for 1 month.   Patient states she did not feel comfortable with Endocrinologist office she was referred to recently. States she would like to be referred to her old office  Vella [facility]irston [facility] [place]. Advised patient I would forward information to provider.

## 2018-06-06 ENCOUNTER — Other Ambulatory Visit (INDEPENDENT_AMBULATORY_CARE_PROVIDER_SITE_OTHER): Payer: BLUE CROSS/BLUE SHIELD

## 2018-06-06 ENCOUNTER — Ambulatory Visit: Payer: BLUE CROSS/BLUE SHIELD | Admitting: Family Medicine

## 2018-06-06 DIAGNOSIS — M25572 Pain in left ankle and joints of left foot: Secondary | ICD-10-CM | POA: Diagnosis not present

## 2018-06-06 DIAGNOSIS — E559 Vitamin D deficiency, unspecified: Secondary | ICD-10-CM

## 2018-06-06 DIAGNOSIS — E538 Deficiency of other specified B group vitamins: Secondary | ICD-10-CM | POA: Diagnosis not present

## 2018-06-06 NOTE — Addendum Note (Signed)
Addended by: Maevis Mumby A on: 06/06/2018 02:05 PM   Modules accepted: Orders  

## 2018-06-06 NOTE — Addendum Note (Signed)
Addended by: Angellee Cohill A on: 06/06/2018 02:04 PM   Modules accepted: Orders  

## 2018-06-06 NOTE — Addendum Note (Signed)
Addended by: Mervin KungFERGERSON, [place] Oborn A on: 06/06/2018 02:04 PM   Modules accepted: Orders

## 2018-06-06 NOTE — Addendum Note (Signed)
Addended by: Mervin KungFERGERSON, [place] Joynt A on: 06/06/2018 02:05 PM   Modules accepted: Orders

## 2018-06-10 LAB — VITAMIN D 1,25 DIHYDROXY
VITAMIN D 1, 25 (OH) TOTAL: 41 pg/mL (ref 18–72)
VITAMIN D2 1, 25 (OH): 29 pg/mL
VITAMIN D3 1, 25 (OH): 12 pg/mL

## 2018-06-10 LAB — COMPREHENSIVE METABOLIC PANEL
AG Ratio: 1.6 (calc) (ref 1.0–2.5)
ALBUMIN MSPROF: 4.1 g/dL (ref 3.6–5.1)
ALKALINE PHOSPHATASE (APISO): 69 U/L (ref 33–130)
ALT: 17 U/L (ref 6–29)
AST: 18 U/L (ref 10–35)
BILIRUBIN TOTAL: 0.3 mg/dL (ref 0.2–1.2)
BUN: 9 mg/dL (ref 7–25)
CHLORIDE: 103 mmol/L (ref 98–110)
CO2: 31 mmol/L (ref 20–32)
CREATININE: 0.85 mg/dL (ref 0.50–1.05)
Calcium: 9.2 mg/dL (ref 8.6–10.4)
GLOBULIN: 2.5 g/dL (ref 1.9–3.7)
Glucose, Bld: 97 mg/dL (ref 65–99)
POTASSIUM: 4.2 mmol/L (ref 3.5–5.3)
Sodium: 139 mmol/L (ref 135–146)
TOTAL PROTEIN: 6.6 g/dL (ref 6.1–8.1)

## 2018-06-10 LAB — URIC ACID: Uric Acid, Serum: 6.3 mg/dL (ref 2.5–7.0)

## 2018-06-10 LAB — VITAMIN B12: Vitamin B-12: 657 pg/mL (ref 200–1100)

## 2018-06-13 ENCOUNTER — Other Ambulatory Visit: Payer: BLUE CROSS/BLUE SHIELD

## 2018-06-13 ENCOUNTER — Ambulatory Visit: Payer: BLUE CROSS/BLUE SHIELD

## 2018-06-16 ENCOUNTER — Ambulatory Visit (INDEPENDENT_AMBULATORY_CARE_PROVIDER_SITE_OTHER): Payer: BLUE CROSS/BLUE SHIELD

## 2018-06-16 ENCOUNTER — Encounter: Payer: Self-pay | Admitting: Family Medicine

## 2018-06-16 ENCOUNTER — Ambulatory Visit: Payer: BLUE CROSS/BLUE SHIELD | Admitting: Family Medicine

## 2018-06-16 VITALS — BP 120/70 | HR 69 | Temp 98.4°F | Ht 65.0 in | Wt 230.5 lb

## 2018-06-16 DIAGNOSIS — E538 Deficiency of other specified B group vitamins: Secondary | ICD-10-CM

## 2018-06-16 DIAGNOSIS — J069 Acute upper respiratory infection, unspecified: Secondary | ICD-10-CM

## 2018-06-16 MED ORDER — CYANOCOBALAMIN 1000 MCG/ML IJ SOLN
1000.0000 ug | Freq: Once | INTRAMUSCULAR | Status: AC
  Administered 2018-06-16: 1000 ug via INTRAMUSCULAR

## 2018-06-16 MED ORDER — BENZONATATE 100 MG PO CAPS: 100.0000 mg | ORAL_CAPSULE | Freq: Three times a day (TID) | ORAL | 0 refills | Status: DC | PRN

## 2018-06-16 NOTE — Progress Notes (Signed)
Established Patient Office Visit  Subjective:  Patient ID: Lauren Jones, female    DOB: 1966/06/10  Age: 52 y.o. MRN: [id number]  CC:  Chief Complaint  Patient presents with  . Cough  . Headache    HPI [name] presents for treatment and evaluation of a 2 to 3-day history of nasal congestion postnasal drip cough.  She has had hot and cold spells but no fever or chills.  She is status post tightness in her chest and a sore throat but they have resolved.  She does not smoke and has no asthma history.  Past Medical History:  Diagnosis Date  . Hypothyroidism     Past Surgical History:  Procedure Laterality Date  . THYROIDECTOMY Bilateral 2014-2015?    Family History  Problem Relation Age of Onset  . Hypertension Mother   . Hypertension Father   . Sarcoidosis Father   . Colon cancer Paternal Grandmother     Social History   Socioeconomic History  . Marital status: Single    Spouse name: Not on file  . Number of children: Not on file  . Years of education: Not on file  . Highest education level: Not on file  Occupational History  . Not on file  Social Needs  . Financial resource strain: Not on file  . Food insecurity:    Worry: Not on file    Inability: Not on file  . Transportation needs:    Medical: Not on file    Non-medical: Not on file  Tobacco Use  . Smoking status: Former Smoker    Types: Cigarettes  . Smokeless tobacco: Never Used  Substance and Sexual Activity  . Alcohol use: Yes    Comment: social  . Drug use: Never  . Sexual activity: Not on file  Lifestyle  . Physical activity:    [place] per week: Not on file    Minutes per session: Not on file  . Stress: Not on file  Relationships  . Social connections:    Talks on phone: Not on file    Gets together: Not on file    Attends religious service: Not on file    Active member of club or organization: Not on file    Attends meetings of clubs or organizations: Not on file    Relationship status: Not  on file  . Intimate partner violence:    Fear of current or ex partner: Not on file    Emotionally abused: Not on file    Physically abused: Not on file    Forced sexual activity: Not on file  Other Topics Concern  . Not on file  Social History Narrative  . Not on file    Outpatient Medications Prior to Visit  Medication Sig Dispense Refill  . Cholecalciferol (VITAMIN D3) 1000 units CAPS Take 1 capsule (1,000 Units total) by mouth daily. 90 capsule 1  . cyclobenzaprine (FLEXERIL) 10 MG tablet Take 1 tablet (10 mg total) by mouth daily. 30 tablet 1  . hydrochlorothiazide (HYDRODIURIL) 25 MG tablet Take 1 tablet (25 mg total) by mouth daily. 90 tablet 3  . levothyroxine (SYNTHROID) 175 MCG tablet Take 1 tablet by mouth daily.    . Vitamin D, Ergocalciferol, (DRISDOL) 50000 units CAPS capsule Take 1 capsule (50,000 Units total) by mouth every 7 (seven) Schumm. 12 capsule 1  . cyanocobalamin ((VITAMIN B-12)) injection 1,000 mcg      No facility-administered medications prior to visit.     Allergies  Allergen Reactions  . Furosemide Other (See Comments)    Headache    ROS Review of Systems  Constitutional: Negative for chills, fatigue, fever and unexpected weight change.  HENT: Positive for congestion, postnasal drip and rhinorrhea. Negative for sinus pressure, sinus pain and trouble swallowing.   Eyes: Negative for photophobia and visual disturbance.  Respiratory: Positive for cough. Negative for shortness of breath and wheezing.   Cardiovascular: Negative.   Gastrointestinal: Negative.   Musculoskeletal: Negative for arthralgias and myalgias.  Skin: Negative for pallor and rash.  Allergic/Immunologic: Negative for immunocompromised state.  Neurological: Positive for headaches.  Hematological: Does not bruise/bleed easily.  Psychiatric/Behavioral: Negative.       Objective:    Physical Exam  Constitutional: She is oriented to person, place, and time. She appears  well-developed and well-nourished. No distress.  HENT:  Head: Normocephalic and atraumatic.  Right Ear: External ear normal.  Left Ear: External ear normal.  Mouth/Throat: Oropharynx is clear and moist. No oropharyngeal exudate.  Eyes: Pupils are equal, round, and reactive to light. Conjunctivae are normal. Right eye exhibits no discharge. Left eye exhibits no discharge. No scleral icterus.  Neck: Neck supple. No JVD present. No tracheal deviation present. No thyromegaly present.  Cardiovascular: Normal rate, regular rhythm and normal heart sounds.  Pulmonary/Chest: Effort normal and breath sounds normal. No stridor. No respiratory distress. She has no wheezes. She has no rales.  Abdominal: Bowel sounds are normal.  Lymphadenopathy:    She has no cervical adenopathy.  Neurological: She is alert and oriented to person, place, and time.  Skin: Skin is warm and dry. She is not diaphoretic.  Psychiatric: She has a normal mood and affect. Her behavior is normal.    BP 120/70   Pulse 69   Temp 98.4 F (36.9 C) (Oral)   Ht 5\' 5"  (1.651 m)   Wt 230 lb 8 oz (104.6 kg)   SpO2 98%   BMI 38.36 kg/m  Wt Readings from Last 3 Encounters:  06/16/18 230 lb 8 oz (104.6 kg)  04/11/18 230 lb 6.4 oz (104.5 kg)  03/21/18 226 lb (102.5 kg)   BP Readings from Last 3 Encounters:  06/16/18 120/70  04/11/18 120/76  03/21/18 126/73   Guideline developer:  UpToDate (see UpToDate for funding source) Date Released: June 2014  Health Maintenance Due  Topic Date Due  . HIV Screening  01/22/1982  . COLONOSCOPY  01/22/2017  . PAP SMEAR-Modifier  06/14/2017  . MAMMOGRAM  06/20/2017    There are no preventive care reminders to display for this patient.  Lab Results  Component Value Date   TSH 5.54 (H) 03/24/2018   Lab Results  Component Value Date   WBC 6.3 03/24/2018   HGB 11.6 (L) 03/24/2018   HCT 35.8 03/24/2018   MCV 81.0 03/24/2018   PLT 315 03/24/2018   Lab Results  Component Value  Date   NA 139 06/06/2018   K 4.2 06/06/2018   CO2 31 06/06/2018   GLUCOSE 97 06/06/2018   BUN 9 06/06/2018   CREATININE 0.85 06/06/2018   BILITOT 0.3 06/06/2018   AST 18 06/06/2018   ALT 17 06/06/2018   PROT 6.6 06/06/2018   CALCIUM 9.2 06/06/2018   Lab Results  Component Value Date   CHOL 168 03/24/2018   Lab Results  Component Value Date   HDL 43 (L) 03/24/2018   Lab Results  Component Value Date   LDLCALC 103 (H) 03/24/2018   Lab Results  Component Value  Date   TRIG 119 03/24/2018   Lab Results  Component Value Date   CHOLHDL 3.9 03/24/2018   Lab Results  Component Value Date   HGBA1C 5.8 (H) 03/24/2018      Assessment & Plan:   Problem List Items Addressed This Visit      Respiratory   Viral upper respiratory tract infection - Primary   Relevant Medications   benzonatate (TESSALON) 100 MG capsule      Meds ordered this encounter  Medications  . benzonatate (TESSALON) 100 MG capsule    Sig: Take 1 capsule (100 mg total) by mouth 3 (three) times daily as needed for cough.    Dispense:  30 capsule    Refill:  0    Follow-up: Return in about 1 week (around 06/23/2018), or if symptoms worsen or fail to improve.   Anticipatory guidance was given to the patient upper respiratory tract infection.  Use the Tessalon as needed get plenty of rest and follow-up in 1 week if not improving.

## 2018-06-16 NOTE — Progress Notes (Signed)
Pre visit review using our clinic tool,if applicable. No additional management support is needed unless otherwise documented below in the visit note.   Pt here for monthly B12 injection per   B12 1000mcg given IM, and pt tolerated injection well.  Patient complained of cold symptoms. Appointment scheduler to check into getting patient appointment.  Patient requested lab results. Advised copy of labs had been sent to her via mail. Patient states she never received them if so. Reviewed labs with patient.  Next B12 injection scheduled for July 18, 2018.

## 2018-06-16 NOTE — Patient Instructions (Signed)
Viral Respiratory Infection  A viral respiratory infection is an illness that affects parts of the body that are used for breathing. These include the lungs, nose, and throat. It is caused by a germ called a virus.  Some examples of this kind of infection are:  · A cold.  · The flu (influenza).  · A respiratory syncytial virus (RSV) infection.  A person who gets this illness may have the following symptoms:  · A stuffy or runny nose.  · Yellow or green fluid in the nose.  · A cough.  · Sneezing.  · Tiredness (fatigue).  · Achy muscles.  · A sore throat.  · Sweating or chills.  · A fever.  · A headache.  Follow these instructions at home:  Managing pain and congestion  · Take over-the-counter and prescription medicines only as told by your doctor.  · If you have a sore throat, gargle with salt water. Do this 3-4 times per day or as needed. To make a salt-water mixture, dissolve ½-1 tsp of salt in 1 cup of warm water. Make sure that all the salt dissolves.  · Use nose drops made from salt water. This helps with stuffiness (congestion). It also helps soften the skin around your nose.  · Drink enough fluid to keep your pee (urine) pale yellow.  General instructions    · Rest as much as possible.  · Do not drink alcohol.  · Do not use any products that have nicotine or tobacco, such as cigarettes and e-cigarettes. If you need help quitting, ask your doctor.  · Keep all follow-up visits as told by your doctor. This is important.  How is this prevented?    · Get a flu shot every year. Ask your doctor when you should get your flu shot.  · Do not let other people get your germs. If you are sick:  ? Stay home from work or school.  ? Wash your hands with soap and water often. Wash your hands after you cough or sneeze. If soap and water are not available, use hand sanitizer.  · Avoid contact with people who are sick during cold and flu season. This is in fall and winter.  Get help if:  · Your symptoms last for 10 Gerdeman or  longer.  · Your symptoms get worse over time.  · You have a fever.  · You have very bad pain in your face or forehead.  · Parts of your jaw or neck become very swollen.  Get help right away if:  · You feel pain or pressure in your chest.  · You have shortness of breath.  · You faint or feel like you will faint.  · You keep throwing up (vomiting).  · You feel confused.  Summary  · A viral respiratory infection is an illness that affects parts of the body that are used for breathing.  · Examples of this illness include a cold, the flu, and respiratory syncytial virus (RSV) infection.  · The infection can cause a runny nose, cough, sneezing, sore throat, and fever.  · Follow what your doctor tells you about taking medicines, drinking lots of fluid, washing your hands, resting at home, and avoiding people who are sick.  This information is not intended to replace advice given to you by your health care provider. Make sure you discuss any questions you have with your health care provider.  Document Released: 05/24/2008 Document Revised: 07/22/2017 Document Reviewed: 07/22/2017  Elsevier   Interactive Patient Education © 2019 [employer]vier Inc.

## 2018-06-20 ENCOUNTER — Ambulatory Visit: Payer: Self-pay | Admitting: *Deleted

## 2018-06-20 ENCOUNTER — Ambulatory Visit: Payer: BLUE CROSS/BLUE SHIELD | Admitting: Family Medicine

## 2018-06-20 MED ORDER — AZITHROMYCIN 250 MG PO TABS: ORAL_TABLET | ORAL | 0 refills | Status: DC

## 2018-06-20 NOTE — Telephone Encounter (Signed)
Please send a ZPack in for patient. If she is not improved in 2 Schumacher after starting, she must be seen some where.

## 2018-06-20 NOTE — Telephone Encounter (Signed)
Rx sent in & patient is aware & also aware she needs to be seen if no improvement in 2 Homeyer.

## 2018-06-20 NOTE — Telephone Encounter (Signed)
Contacted pt regarding symptoms; she states that she was seen in the office on 06/16/18  By Dr Doreene BurkeKremer, [facility], and was prescribe tessalon pearls; she says that she is having a constant cough that keeps her awake, green secretion, and nasal/chest congestion; she also has chest tightness, headache, and feels achy all over; the pt also has tried delsym without relief; nurse triage initiated and recommendations made per protocol; she [facility]ne[facility] but there is no availability; she is agreeable to seen in another office; pt offered and accepted appointment with Dr Barron Alvineirigliano, [facility] 06/20/18 at 1500; she verbalizes understanding, but states that she does not have her co-payment; [facility] and per Dr Doreene BurkeKremer, since the pt was seen on 06/16/18, he will review her chart and send in a prescription; relayed information to pt and she verbalizes understanding; the  [address] [facility] [address]  [place], [place] (Store # [phone]); she can also be contacted at [phone]; a detailed message can be left; will also cancel previously scheduled appointment with Dr Carloyn Mannerirgliano; will also route to office for final disposition.    Reason for Disposition . [1] Continuous (nonstop) coughing interferes with work or school AND [2] no improvement using cough treatment per Care Advice  Answer Assessment - Initial Assessment Questions 1. ONSET: "When did the cough begin?"      Seen in office 06/16/18 2. SEVERITY: "How bad is the cough today?"      severe 3. RESPIRATORY DISTRESS: "Describe your breathing."      Chest Tight when laying down  4. FEVER: "Do you have a fever?" If so, ask: "What is your temperature, how was it measured, and when did it start?"     Pt says she felt warm  5. SPUTUM: "Describe the color of your sputum" (clear, white, yellow, green)     green 6. HEMOPTYSIS: "Are you coughing up any blood?" If so ask: "How much?" (flecks, streaks, tablespoons, etc.)  Streaks of blood in mucus 7. CARDIAC HISTORY: "Do you have any history of heart disease?" (e.g., heart attack, congestive heart failure)      High blood pressure  8. LUNG HISTORY: "Do you have any history of lung disease?"  (e.g., pulmonary embolus, asthma, emphysema)     no 9. PE RISK FACTORS: "Do you have a history of blood clots?" (or: recent major surgery, recent prolonged travel, bedridden)     no 10. OTHER SYMPTOMS: "Do you have any other symptoms?" (e.g., runny nose, wheezing, chest pain)       Nasal and chest congestion; feels achy all over, chills 11. PREGNANCY: "Is there any chance you are pregnant?" "When was your last menstrual period?"       No LMP 06/02/18 12. TRAVEL: "Have you traveled out of the country in the last month?" (e.g., travel history, exposures)       no  Protocols used: COUGH - ACUTE PRODUCTIVE-A-AH

## 2018-06-20 NOTE — Addendum Note (Signed)
Addended by: Marcell AngerSELF, Boots Mcglown E on: 06/20/2018 11:09 AM   Modules accepted: Orders

## 2018-07-03 ENCOUNTER — Encounter (INDEPENDENT_AMBULATORY_CARE_PROVIDER_SITE_OTHER): Payer: Self-pay

## 2018-07-14 ENCOUNTER — Telehealth: Payer: Self-pay | Admitting: *Deleted

## 2018-07-14 NOTE — Telephone Encounter (Signed)
Pt is scheduled for lab appointment on 07/18/18 for "3 month labs" but I do not see any future orders in Epic.  Please advise or place appropriate orders?

## 2018-07-15 NOTE — Telephone Encounter (Signed)
I do not see where she is due for any labs.  I know at one time that had came for an ov and it threw her off, not sure why lab appt for the 24th was not canceled.

## 2018-07-16 NOTE — Telephone Encounter (Signed)
Attempted to reach pt and left detailed message that lab appt is not needed at this time and appt is being cancelled and to call the office if any further concerns. Ok for [facility] / triage to discuss with pt if she calls back.

## 2018-07-18 ENCOUNTER — Other Ambulatory Visit: Payer: BLUE CROSS/BLUE SHIELD

## 2018-07-18 ENCOUNTER — Ambulatory Visit (INDEPENDENT_AMBULATORY_CARE_PROVIDER_SITE_OTHER): Payer: BLUE CROSS/BLUE SHIELD

## 2018-07-18 DIAGNOSIS — E538 Deficiency of other specified B group vitamins: Secondary | ICD-10-CM

## 2018-07-18 MED ORDER — CYANOCOBALAMIN 1000 MCG/ML IJ SOLN
1000.0000 ug | Freq: Once | INTRAMUSCULAR | Status: AC
  Administered 2018-07-18: 1000 ug via INTRAMUSCULAR

## 2018-07-18 NOTE — Progress Notes (Addendum)
Pt here for monthly B12 injection per Dr. Laury AxonLowne  B12 1000mcg given IM in left deltoid and pt tolerated injection well  Next B12 injection scheduled for next month and march.   Routed to Dr. Drue NovelPaz in absence of PCP on Friday.  Willow OraJose Paz, MD

## 2018-08-11 DIAGNOSIS — Z111 Encounter for screening for respiratory tuberculosis: Secondary | ICD-10-CM | POA: Diagnosis not present

## 2018-08-13 DIAGNOSIS — Z111 Encounter for screening for respiratory tuberculosis: Secondary | ICD-10-CM | POA: Diagnosis not present

## 2018-08-14 ENCOUNTER — Ambulatory Visit (INDEPENDENT_AMBULATORY_CARE_PROVIDER_SITE_OTHER): Payer: BLUE CROSS/BLUE SHIELD | Admitting: Family

## 2018-08-14 ENCOUNTER — Encounter: Payer: Self-pay | Admitting: Family

## 2018-08-14 VITALS — BP 129/84 | HR 58 | Temp 98.2°F | Resp 18 | Ht 66.0 in | Wt 233.4 lb

## 2018-08-14 DIAGNOSIS — J069 Acute upper respiratory infection, unspecified: Secondary | ICD-10-CM | POA: Diagnosis not present

## 2018-08-14 DIAGNOSIS — R52 Pain, unspecified: Secondary | ICD-10-CM | POA: Diagnosis not present

## 2018-08-14 DIAGNOSIS — B9789 Other viral agents as the cause of diseases classified elsewhere: Secondary | ICD-10-CM | POA: Diagnosis not present

## 2018-08-14 LAB — POC INFLUENZA A&B (BINAX/QUICKVUE)
Influenza A, POC: NEGATIVE
Influenza B, POC: NEGATIVE

## 2018-08-14 NOTE — Patient Instructions (Addendum)
You may use tylenol or motrin as needed for pain/fever. Continue tessalon as needed for cough. Call if new/worsening symptoms or if not improved in 3-4 [place].    Viral Illness, Adult Viruses are tiny germs that can get into a person's body and cause illness. There are many different types of viruses, and they cause many types of illness. Viral illnesses can range from mild to severe. They can affect various parts of the body. Common illnesses that are caused by a virus include colds and the flu. Viral illnesses also include serious conditions such as HIV/AIDS (human immunodeficiency virus/acquired immunodeficiency syndrome). A few viruses have been linked to certain cancers. What are the causes? Many types of viruses can cause illness. Viruses invade cells in your body, multiply, and cause the infected cells to malfunction or die. When the cell dies, it releases more of the virus. When this happens, you develop symptoms of the illness, and the virus continues to spread to other cells. If the virus takes over the function of the cell, it can cause the cell to divide and grow out of control, as is the case when a virus causes cancer. Different viruses get into the body in different ways. You can get a virus by:  Swallowing food or water that is contaminated with the virus.  Breathing in droplets that have been coughed or sneezed into the air by an infected person.  Touching a surface that has been contaminated with the virus and then touching your eyes, nose, or mouth.  Being bitten by an insect or animal that carries the virus.  Having sexual contact with a person who is infected with the virus.  Being exposed to blood or fluids that contain the virus, either through an open cut or during a transfusion. If a virus enters your body, your body's defense system (immune system) will try to fight the virus. You may be at higher risk for a viral illness if your immune system is weak. What are the  signs or symptoms? Symptoms vary depending on the type of virus and the location of the cells that it invades. Common symptoms of the main types of viral illnesses include: Cold and flu viruses  Fever.  Headache.  Sore throat.  Muscle aches.  Nasal congestion.  Cough. Digestive system (gastrointestinal) viruses  Fever.  Abdominal pain.  Nausea.  Diarrhea. Liver viruses (hepatitis)  Loss of appetite.  Tiredness.  Yellowing of the skin (jaundice). Brain and spinal cord viruses  Fever.  Headache.  Stiff neck.  Nausea and vomiting.  Confusion or sleepiness. Skin viruses  Warts.  Itching.  Rash. Sexually transmitted viruses  Discharge.  Swelling.  Redness.  Rash. How is this treated? Viruses can be difficult to treat because they live within cells. Antibiotic medicines do not treat viruses because these drugs do not get inside cells. Treatment for a viral illness may include:  Resting and drinking plenty of fluids.  Medicines to relieve symptoms. These can include over-the-counter medicine for pain and fever, medicines for cough or congestion, and medicines to relieve diarrhea.  Antiviral medicines. These drugs are available only for certain types of viruses. They may help reduce flu symptoms if taken early. There are also many antiviral medicines for hepatitis and HIV/AIDS. Some viral illnesses can be prevented with vaccinations. A common example is the flu shot. Follow these instructions at home: Medicines   Take over-the-counter and prescription medicines only as told by your health care provider.  If you were prescribed  an antiviral medicine, take it as told by your health care provider. Do not stop taking the medicine even if you start to feel better.  Be aware of when antibiotics are needed and when they are not needed. Antibiotics do not treat viruses. If your health care provider thinks that you may have a bacterial infection as well as a  viral infection, you may get an antibiotic. ? Do not ask for an antibiotic prescription if you have been diagnosed with a viral illness. That will not make your illness go away faster. ? Frequently taking antibiotics when they are not needed can lead to antibiotic resistance. When this develops, the medicine no longer works against the bacteria that it normally fights. General instructions  Drink enough fluids to keep your urine clear or pale yellow.  Rest as much as possible.  Return to your normal activities as told by your health care provider. Ask your health care provider what activities are safe for you.  Keep all follow-up visits as told by your health care provider. This is important. How is this prevented? Take these actions to reduce your risk of viral infection:  Eat a healthy diet and get enough rest.  Wash your hands often with soap and water. This is especially important when you are in public places. If soap and water are not available, use hand sanitizer.  Avoid close contact with friends and family who have a viral illness.  If you travel to areas where viral gastrointestinal infection is common, avoid drinking water or eating raw food.  Keep your immunizations up to date. Get a flu shot every year as told by your health care provider.  Do not share toothbrushes, nail clippers, razors, or needles with other people.  Always practice safe sex.  Contact a health care provider if:  You have symptoms of a viral illness that do not go away.  Your symptoms come back after going away.  Your symptoms get worse. Get help right away if:  You have trouble breathing.  You have a severe headache or a stiff neck.  You have severe vomiting or abdominal pain. This information is not intended to replace advice given to you by your health care provider. Make sure you discuss any questions you have with your health care provider. Document Released: 10/21/2015 Document  Revised: 11/23/2015 Document Reviewed: 10/21/2015 Elsevier Interactive Patient Education  2019 [employer].

## 2018-08-14 NOTE — Progress Notes (Signed)
Subjective:    Patient ID: Lauren Jones, female    DOB: 07-23-1965, 53 y.o.   MRN: [id number]  HPI  Patient is a 53 yr old female who presents today with c/o body aches, chills and cough.  Symptoms began yesterday. Reports symptoms are worsening.  She is using tessalon pearls for cough which are helping "a little bit but not that much." Reports "everything hurts." Denies sore throat. She denies HA. 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  She also spent time around some sick patients in a group home setting.    Review of Systems    see HPI  Past Medical History:  Diagnosis Date  . Hypothyroidism      Social History   Socioeconomic History  . Marital status: Single    Spouse name: Not on file  . Number of children: Not on file  . Years of education: Not on file  . Highest education level: Not on file  Occupational History  . Not on file  Social Needs  . Financial resource strain: Not on file  . Food insecurity:    Worry: Not on file    Inability: Not on file  . Transportation needs:    Medical: Not on file    Non-medical: Not on file  Tobacco Use  . Smoking status: Former Smoker    Types: Cigarettes  . Smokeless tobacco: Never Used  Substance and Sexual Activity  . Alcohol use: Yes    Comment: social  . Drug use: Never  . Sexual activity: Not on file  Lifestyle  . Physical activity:    [place] per week: Not on file    Minutes per session: Not on file  . Stress: Not on file  Relationships  . Social connections:    Talks on phone: Not on file    Gets together: Not on file    Attends religious service: Not on file    Active member of club or organization: Not on file    Attends meetings of clubs or organizations: Not on file    Relationship status: Not on file  . Intimate partner violence:    Fear of current or ex partner: Not on file    Emotionally abused: Not on file    Physically abused: Not on file    Forced sexual activity: Not on file   Other Topics Concern  . Not on file  Social History Narrative  . Not on file    Past Surgical History:  Procedure Laterality Date  . THYROIDECTOMY Bilateral 2014-2015?    Family History  Problem Relation Age of Onset  . Hypertension Mother   . Hypertension Father   . Sarcoidosis Father   . Colon cancer Paternal Grandmother     Allergies  Allergen Reactions  . Furosemide Other (See Comments)    Headache    Current Outpatient Medications on File Prior to Visit  Medication Sig Dispense Refill  . benzonatate (TESSALON) 100 MG capsule Take 1 capsule (100 mg total) by mouth 3 (three) times daily as needed for cough. 30 capsule 0  . Cholecalciferol (VITAMIN D3) 1000 units CAPS Take 1 capsule (1,000 Units total) by mouth daily. 90 capsule 1  . cyclobenzaprine (FLEXERIL) 10 MG tablet Take 1 tablet (10 mg total) by mouth daily. 30 tablet 1  . hydrochlorothiazide (HYDRODIURIL) 25 MG tablet Take 1 tablet (25 mg total) by mouth daily. 90 tablet 3  . levothyroxine (SYNTHROID) 175 MCG  tablet Take 1 tablet by mouth daily.    . Vitamin D, Ergocalciferol, (DRISDOL) 50000 units CAPS capsule Take 1 capsule (50,000 Units total) by mouth every 7 (seven) Haze. 12 capsule 1   No current facility-administered medications on file prior to visit.     BP 129/84 (BP Location: Right Arm, Patient Position: Sitting, Cuff Size: Large)   Pulse (!) 58   Temp 98.2 F (36.8 C) (Oral)   Resp 18   Ht 5\' 6"  (1.676 m)   Wt 233 lb 6.4 oz (105.9 kg)   SpO2 99%   BMI 37.67 kg/m    Objective:   Physical Exam Constitutional:      Appearance: She is well-developed.  HENT:     Right Ear: Tympanic membrane and ear canal normal.     Left Ear: Tympanic membrane and ear canal normal.     Mouth/Throat:     Pharynx: No oropharyngeal exudate or posterior oropharyngeal erythema.  Neck:     Musculoskeletal: Neck supple.     Thyroid: No thyromegaly.  Cardiovascular:     Rate and Rhythm: Normal rate and regular  rhythm.     Heart sounds: Normal heart sounds. No murmur.  Pulmonary:     Effort: Pulmonary effort is normal. No respiratory distress.     Breath sounds: Normal breath sounds. No wheezing.  Lymphadenopathy:     Cervical: No cervical adenopathy.  Skin:    General: Skin is warm and dry.  Neurological:     Mental Status: She is alert and oriented to person, place, and time.  Psychiatric:        Behavior: Behavior normal.        Thought Content: Thought content normal.        Judgment: Judgment normal.           Assessment & Plan:  Viral URI with cough- Rapid flu test is negative. Pt is advised as follows:   You may use tylenol or motrin as needed for pain/fever. mucinex and nasal saline spray for nasal congestion. Continue tessalon as needed for cough. Call if new/worsening symptoms or if not improved in 3-4 [facility].

## 2018-08-15 ENCOUNTER — Encounter: Payer: Self-pay | Admitting: Family Medicine

## 2018-08-15 ENCOUNTER — Ambulatory Visit: Payer: BLUE CROSS/BLUE SHIELD | Admitting: Family Medicine

## 2018-08-15 ENCOUNTER — Ambulatory Visit (INDEPENDENT_AMBULATORY_CARE_PROVIDER_SITE_OTHER): Payer: BLUE CROSS/BLUE SHIELD | Admitting: Family Medicine

## 2018-08-15 VITALS — BP 128/78 | HR 85 | Temp 98.7°F | Ht 66.0 in | Wt 230.0 lb

## 2018-08-15 DIAGNOSIS — R05 Cough: Secondary | ICD-10-CM

## 2018-08-15 DIAGNOSIS — J40 Bronchitis, not specified as acute or chronic: Secondary | ICD-10-CM | POA: Insufficient documentation

## 2018-08-15 MED ORDER — AZITHROMYCIN 250 MG PO TABS: ORAL_TABLET | ORAL | 0 refills | Status: DC

## 2018-08-15 MED ORDER — ALBUTEROL SULFATE HFA 108 (90 BASE) MCG/ACT IN AERS: 2.0000 | INHALATION_SPRAY | Freq: Four times a day (QID) | RESPIRATORY_TRACT | 2 refills | Status: DC | PRN

## 2018-08-15 NOTE — Patient Instructions (Signed)
Acute Bronchitis, Adult Acute bronchitis is when air tubes (bronchi) in the lungs suddenly get swollen. The condition can make it hard to breathe. It can also cause these symptoms:  A cough.  Coughing up clear, yellow, or green mucus.  Wheezing.  Chest congestion.  Shortness of breath.  A fever.  Body aches.  Chills.  A sore throat. Follow these instructions at home:  Medicines  Take over-the-counter and prescription medicines only as told by your doctor.  If you were prescribed an antibiotic medicine, take it as told by your doctor. Do not stop taking the antibiotic even if you start to feel better. General instructions  Rest.  Drink enough fluids to keep your pee (urine) pale yellow.  Avoid smoking and secondhand smoke. If you smoke and you need help quitting, ask your doctor. Quitting will help your lungs heal faster.  Use an inhaler, cool mist vaporizer, or humidifier as told by your doctor.  Keep all follow-up visits as told by your doctor. This is important. How is this prevented? To lower your risk of getting this condition again:  Wash your hands often with soap and water. If you cannot use soap and water, use hand sanitizer.  Avoid contact with people who have cold symptoms.  Try not to touch your hands to your mouth, nose, or eyes.  Make sure to get the flu shot every year. Contact a doctor if:  Your symptoms do not get better in 2 weeks. Get help right away if:  You cough up blood.  You have chest pain.  You have very bad shortness of breath.  You become dehydrated.  You faint (pass out) or keep feeling like you are going to pass out.  You keep throwing up (vomiting).  You have a very bad headache.  Your fever or chills gets worse. This information is not intended to replace advice given to you by your health care provider. Make sure you discuss any questions you have with your health care provider. Document Released: 11/28/2007 Document  Revised: 01/23/2017 Document Reviewed: 11/30/2015 Elsevier Interactive Patient Education  2019 [employer] Inc[employer]  

## 2018-08-15 NOTE — Progress Notes (Signed)
Patient ID: Lauren Jones, female    DOB: 04-01-1966  Age: 53 y.o. MRN: [id number]    Subjective:  Subjective  HPI   Review of Systems  History Past Medical History:  Diagnosis Date  . Hypothyroidism     She has a past surgical history that includes Thyroidectomy (Bilateral, 2014-2015?).   Her family history includes Colon cancer in her paternal grandmother; Hypertension in her father and mother; Sarcoidosis in her father.She reports that she has quit smoking. Her smoking use included cigarettes. She has never used smokeless tobacco. She reports current alcohol use. She reports that she does not use drugs.  Current Outpatient Medications on File Prior to Visit  Medication Sig Dispense Refill  . benzonatate (TESSALON) 100 MG capsule Take 1 capsule (100 mg total) by mouth 3 (three) times daily as needed for cough. 30 capsule 0  . Cholecalciferol (VITAMIN D3) 1000 units CAPS Take 1 capsule (1,000 Units total) by mouth daily. 90 capsule 1  . cyclobenzaprine (FLEXERIL) 10 MG tablet Take 1 tablet (10 mg total) by mouth daily. 30 tablet 1  . hydrochlorothiazide (HYDRODIURIL) 25 MG tablet Take 1 tablet (25 mg total) by mouth daily. 90 tablet 3  . levothyroxine (SYNTHROID) 175 MCG tablet Take 1 tablet by mouth daily.    . Vitamin D, Ergocalciferol, (DRISDOL) 50000 units CAPS capsule Take 1 capsule (50,000 Units total) by mouth every 7 (seven) Barley. 12 capsule 1   No current facility-administered medications on file prior to visit.      Objective:  Objective  Physical Exam Vitals signs and nursing note reviewed.  Constitutional:      Appearance: She is well-developed.  HENT:     Right Ear: External ear normal.     Left Ear: External ear normal.  Eyes:     General:        Right eye: No discharge.        Left eye: No discharge.     Conjunctiva/sclera: Conjunctivae normal.  Cardiovascular:     Rate and Rhythm: Normal rate and regular rhythm.     Heart sounds: Normal heart sounds. No  murmur.  Pulmonary:     Effort: Pulmonary effort is normal. No respiratory distress.     Breath sounds: Decreased air movement present. Decreased breath sounds present. No wheezing or rales.  Chest:     Chest wall: No tenderness.  Lymphadenopathy:     Cervical: Cervical adenopathy present.  Neurological:     Mental Status: She is alert and oriented to person, place, and time.    BP 128/78   Pulse 85   Temp 98.7 F (37.1 C)   Ht 5\' 6"  (1.676 m)   Wt 230 lb (104.3 kg)   SpO2 96%   BMI 37.12 kg/m  Wt Readings from Last 3 Encounters:  08/15/18 230 lb (104.3 kg)  08/14/18 233 lb 6.4 oz (105.9 kg)  06/16/18 230 lb 8 oz (104.6 kg)     Lab Results  Component Value Date   WBC 6.3 03/24/2018   HGB 11.6 (L) 03/24/2018   HCT 35.8 03/24/2018   PLT 315 03/24/2018   GLUCOSE 97 06/06/2018   CHOL 168 03/24/2018   TRIG 119 03/24/2018   HDL 43 (L) 03/24/2018   LDLCALC 103 (H) 03/24/2018   ALT 17 06/06/2018   AST 18 06/06/2018   NA 139 06/06/2018   K 4.2 06/06/2018   CL 103 06/06/2018   CREATININE 0.85 06/06/2018   BUN 9 06/06/2018   CO2  31 06/06/2018   TSH 5.54 (H) 03/24/2018   HGBA1C 5.8 (H) 03/24/2018    No results found.   Assessment & Plan:  Plan  I am having Emali Plotz start on albuterol and azithromycin. I am also having her maintain her levothyroxine, cyclobenzaprine, Vitamin D (Ergocalciferol), Vitamin D3, hydrochlorothiazide, and benzonatate.  Meds ordered this encounter  Medications  . albuterol (PROVENTIL HFA;VENTOLIN HFA) 108 (90 Base) MCG/ACT inhaler    Sig: Inhale 2 puffs into the lungs every 6 (six) hours as needed for wheezing or shortness of breath.    Dispense:  1 Inhaler    Refill:  2  . azithromycin (ZITHROMAX Z-PAK) 250 MG tablet    Sig: As directed    Dispense:  6 each    Refill:  0    Problem List Items Addressed This Visit    None    Visit Diagnoses    Cough    -  Primary   Relevant Orders   DG Chest 2 View   Bronchitis       Relevant  Medications   albuterol (PROVENTIL HFA;VENTOLIN HFA) 108 (90 Base) MCG/ACT inhaler   azithromycin (ZITHROMAX Z-PAK) 250 MG tablet      Follow-up: No follow-ups on file.  Donato Schultz, DO

## 2018-08-15 NOTE — Assessment & Plan Note (Addendum)
rx z pack Albuterol inh Pt refused prednisone and cxr con't tessalon perles

## 2018-08-15 NOTE — Patient Instructions (Signed)

## 2018-08-15 NOTE — Progress Notes (Signed)
Patient ID: Lauren Jones, female    DOB: [birth year]  Age: 53 y.o. MRN: [id number]    Subjective:  Subjective  HPI Lauren Jones presents for worsening cough, productive, fever .  She was here yesterday and no rx given.  She worsened over night.  Had a fever and felt tight in chest     Review of Systems  Constitutional: Positive for fatigue and fever. Negative for activity change, appetite change, chills and unexpected weight change.  HENT: Positive for congestion. Negative for hearing loss, sinus pressure, sinus pain, sneezing and sore throat.   Eyes: Negative for discharge.  Respiratory: Positive for cough and wheezing. Negative for shortness of breath.   Cardiovascular: Negative for chest pain, palpitations and leg swelling.  Gastrointestinal: Negative for abdominal pain, blood in stool, constipation, diarrhea, nausea and vomiting.  Genitourinary: Negative for dysuria, frequency, hematuria and urgency.  Musculoskeletal: Negative for back pain and myalgias.  Skin: Negative for rash.  Allergic/Immunologic: Negative for environmental allergies.  Neurological: Negative for dizziness, weakness and headaches.  Hematological: Does not bruise/bleed easily.  Psychiatric/Behavioral: Negative for behavioral problems, dysphoric mood and suicidal ideas. The patient is not nervous/anxious.     History Past Medical History:  Diagnosis Date  . Hypothyroidism     She has a past surgical history that includes Thyroidectomy (Bilateral, 2014-2015?).   Her family history includes Colon cancer in her paternal grandmother; Hypertension in her father and mother; Sarcoidosis in her father.She reports that she has quit smoking. Her smoking use included cigarettes. She has never used smokeless tobacco. She reports current alcohol use. She reports that she does not use drugs.  Current Outpatient Medications on File Prior to Visit  Medication Sig Dispense Refill  . albuterol (PROVENTIL HFA;VENTOLIN HFA) 108 (90 Base)  MCG/ACT inhaler Inhale 2 puffs into the lungs every 6 (six) hours as needed for wheezing or shortness of breath. 1 Inhaler 2  . azithromycin (ZITHROMAX Z-PAK) 250 MG tablet As directed 6 each 0  . benzonatate (TESSALON) 100 MG capsule Take 1 capsule (100 mg total) by mouth 3 (three) times daily as needed for cough. 30 capsule 0  . Cholecalciferol (VITAMIN D3) 1000 units CAPS Take 1 capsule (1,000 Units total) by mouth daily. 90 capsule 1  . cyclobenzaprine (FLEXERIL) 10 MG tablet Take 1 tablet (10 mg total) by mouth daily. 30 tablet 1  . hydrochlorothiazide (HYDRODIURIL) 25 MG tablet Take 1 tablet (25 mg total) by mouth daily. 90 tablet 3  . levothyroxine (SYNTHROID) 175 MCG tablet Take 1 tablet by mouth daily.    . Vitamin D, Ergocalciferol, (DRISDOL) 50000 units CAPS capsule Take 1 capsule (50,000 Units total) by mouth every 7 (seven) [place]. 12 capsule 1   No current facility-administered medications on file prior to visit.      Objective:  Objective  Physical Exam Vitals signs and nursing note reviewed.  Constitutional:      Appearance: She is well-developed.  HENT:     Head: Normocephalic and atraumatic.  Eyes:     Conjunctiva/sclera: Conjunctivae normal.  Neck:     Musculoskeletal: Normal range of motion and neck supple.     Thyroid: No thyromegaly.     Vascular: No carotid bruit or JVD.  Cardiovascular:     Rate and Rhythm: Normal rate and regular rhythm.     Heart sounds: Normal heart sounds. No murmur.  Pulmonary:     Effort: Pulmonary effort is normal. No respiratory distress.     Breath sounds: Decreased breath  sounds and wheezing present. No rales.  Chest:     Chest wall: No tenderness.  Neurological:     Mental Status: She is alert and oriented to person, place, and time.    There were no vitals taken for this visit. Wt Readings from Last 3 Encounters:  08/15/18 230 lb (104.3 kg)  08/14/18 233 lb 6.4 oz (105.9 kg)  06/16/18 230 lb 8 oz (104.6 kg)     Lab  Results  Component Value Date   WBC 6.3 03/24/2018   HGB 11.6 (L) 03/24/2018   HCT 35.8 03/24/2018   PLT 315 03/24/2018   GLUCOSE 97 06/06/2018   CHOL 168 03/24/2018   TRIG 119 03/24/2018   HDL 43 (L) 03/24/2018   LDLCALC 103 (H) 03/24/2018   ALT 17 06/06/2018   AST 18 06/06/2018   NA 139 06/06/2018   K 4.2 06/06/2018   CL 103 06/06/2018   CREATININE 0.85 06/06/2018   BUN 9 06/06/2018   CO2 31 06/06/2018   TSH 5.54 (H) 03/24/2018   HGBA1C 5.8 (H) 03/24/2018    No results found.   Assessment & Plan:  Plan  I am having [name] maintain her levothyroxine, cyclobenzaprine, Vitamin D (Ergocalciferol), Vitamin D3, hydrochlorothiazide, benzonatate, albuterol, and azithromycin.  No orders of the defined types were placed in this encounter.   Problem List Items Addressed This Visit      Unprioritized   Bronchitis    rx z pack Albuterol inh Pt refused prednisone and cxr con't tessalon perles           Follow-up: Return if symptoms worsen or fail to improve.  Donato Schultz, DO

## 2018-08-18 ENCOUNTER — Ambulatory Visit: Payer: BLUE CROSS/BLUE SHIELD | Admitting: Family Medicine

## 2018-08-22 ENCOUNTER — Ambulatory Visit (INDEPENDENT_AMBULATORY_CARE_PROVIDER_SITE_OTHER): Payer: BLUE CROSS/BLUE SHIELD

## 2018-08-22 DIAGNOSIS — E538 Deficiency of other specified B group vitamins: Secondary | ICD-10-CM | POA: Diagnosis not present

## 2018-08-22 MED ORDER — CYANOCOBALAMIN 1000 MCG/ML IJ SOLN
1000.0000 ug | Freq: Once | INTRAMUSCULAR | Status: AC
  Administered 2018-08-22: 1000 ug via INTRAMUSCULAR

## 2018-08-22 NOTE — Progress Notes (Addendum)
Pt here for monthly B12 injection per  Dr. Laury Axon  B12 given IM in left deltoid, and pt tolerated injection well.  Next B12 injection scheduled for next month. Lauren Schultz, DO

## 2018-09-02 ENCOUNTER — Ambulatory Visit: Payer: Self-pay

## 2018-09-02 NOTE — Telephone Encounter (Signed)
Pt called to say she has a cough that has been lingering after antibiotic. She states she has had these symptoms twice since Christmas and Dr Zola Button states that she would need a CXR if symptoms persisted.  She finished her antibiotic about 1.5 weeks ago. She states that she has a burning pain to her upper chest from the cough.  She states her chest is tight and her cough is dry.  She produces phlegm that is white foamy mucus.  She has no fever. She has a mild runny nose. She is taking OTC cough medication Delsym. Appointment scheduled per protocol. Care advice read to patient. Pt verbalized understanding of all instructions. Reason for Disposition . Cough has been present for > 3 weeks  Answer Assessment - Initial Assessment Questions 1. ONSET: "When did the cough begin?"      2 weeks ago 2. SEVERITY: "How bad is the cough today?"      severe 3. RESPIRATORY DISTRESS: "Describe your breathing."      none 4. FEVER: "Do you have a fever?" If so, ask: "What is your temperature, how was it measured, and when did it start?"     No 5. SPUTUM: "Describe the color of your sputum" (clear, white, yellow, green)     White foamy mucus  6. HEMOPTYSIS: "Are you coughing up any blood?" If so ask: "How much?" (flecks, streaks, tablespoons, etc.)     no 7. CARDIAC HISTORY: "Do you have any history of heart disease?" (e.g., heart attack, congestive heart failure)      no 8. LUNG HISTORY: "Do you have any history of lung disease?"  (e.g., pulmonary embolus, asthma, emphysema)     no 9. PE RISK FACTORS: "Do you have a history of blood clots?" (or: recent major surgery, recent prolonged travel, bedridden)     No 10. OTHER SYMPTOMS: "Do you have any other symptoms?" (e.g., runny nose, wheezing, chest pain)       Burning in chest, runny nose is mild 11. PREGNANCY: "Is there any chance you are pregnant?" "When was your last menstrual period?"       No 12. TRAVEL: "Have you traveled out of the country in the  last month?" (e.g., travel history, exposures)       No  Protocols used: COUGH - ACUTE PRODUCTIVE-A-AH

## 2018-09-04 ENCOUNTER — Ambulatory Visit: Payer: BLUE CROSS/BLUE SHIELD | Admitting: Family Medicine

## 2018-09-05 ENCOUNTER — Ambulatory Visit (INDEPENDENT_AMBULATORY_CARE_PROVIDER_SITE_OTHER): Payer: BLUE CROSS/BLUE SHIELD | Admitting: Family Medicine

## 2018-09-05 ENCOUNTER — Encounter: Payer: Self-pay | Admitting: Family Medicine

## 2018-09-05 ENCOUNTER — Other Ambulatory Visit: Payer: Self-pay

## 2018-09-05 VITALS — BP 122/84 | HR 60 | Temp 98.6°F | Resp 12 | Ht 66.0 in | Wt 233.0 lb

## 2018-09-05 DIAGNOSIS — J0140 Acute pansinusitis, unspecified: Secondary | ICD-10-CM | POA: Diagnosis not present

## 2018-09-05 MED ORDER — PREDNISONE 10 MG PO TABS: ORAL_TABLET | ORAL | 0 refills | Status: DC

## 2018-09-05 MED ORDER — FLUTICASONE PROPIONATE 50 MCG/ACT NA SUSP: 2.0000 | Freq: Every day | NASAL | 6 refills | Status: AC

## 2018-09-05 MED ORDER — LEVOFLOXACIN 500 MG PO TABS: 500.0000 mg | ORAL_TABLET | Freq: Every day | ORAL | 0 refills | Status: DC

## 2018-09-05 NOTE — Patient Instructions (Signed)
Sinusitis, Adult  Sinusitis is inflammation of your sinuses. Sinuses are hollow spaces in the bones around your face. Your sinuses are located:   Around your eyes.   In the middle of your forehead.   Behind your nose.   In your cheekbones.  Mucus normally drains out of your sinuses. When your nasal tissues become inflamed or swollen, mucus can become trapped or blocked. This allows bacteria, viruses, and fungi to grow, which leads to infection. Most infections of the sinuses are caused by a virus.  Sinusitis can develop quickly. It can last for up to 4 weeks (acute) or for more than 12 weeks (chronic). Sinusitis often develops after a cold.  What are the causes?  This condition is caused by anything that creates swelling in the sinuses or stops mucus from draining. This includes:   Allergies.   Asthma.   Infection from bacteria or viruses.   Deformities or blockages in your nose or sinuses.   Abnormal growths in the nose (nasal polyps).   Pollutants, such as chemicals or irritants in the air.   Infection from fungi (rare).  What increases the risk?  You are more likely to develop this condition if you:   Have a weak body defense system (immune system).   Do a lot of swimming or diving.   Overuse nasal sprays.   Smoke.  What are the signs or symptoms?  The main symptoms of this condition are pain and a feeling of pressure around the affected sinuses. Other symptoms include:   Stuffy nose or congestion.   Thick drainage from your nose.   Swelling and warmth over the affected sinuses.   Headache.   Upper toothache.   A cough that may get worse at night.   Extra mucus that collects in the throat or the back of the nose (postnasal drip).   Decreased sense of smell and taste.   Fatigue.   A fever.   Sore throat.   Bad breath.  How is this diagnosed?  This condition is diagnosed based on:   Your symptoms.   Your medical history.   A physical exam.   Tests to find out if your condition is  acute or chronic. This may include:  ? Checking your nose for nasal polyps.  ? Viewing your sinuses using a device that has a light (endoscope).  ? Testing for allergies or bacteria.  ? Imaging tests, such as an MRI or CT scan.  In rare cases, a bone biopsy may be done to rule out more serious types of fungal sinus disease.  How is this treated?  Treatment for sinusitis depends on the cause and whether your condition is chronic or acute.   If caused by a virus, your symptoms should go away on their own within 10 [place]. You may be given medicines to relieve symptoms. They include:  ? Medicines that shrink swollen nasal passages (topical intranasal decongestants).  ? Medicines that treat allergies (antihistamines).  ? A spray that eases inflammation of the nostrils (topical intranasal corticosteroids).  ? Rinses that help get rid of thick mucus in your nose (nasal saline washes).   If caused by bacteria, your health care provider may recommend waiting to see if your symptoms improve. Most bacterial infections will get better without antibiotic medicine. You may be given antibiotics if you have:  ? A severe infection.  ? A weak immune system.   If caused by narrow nasal passages or nasal polyps, you may need   to have surgery.  Follow these instructions at home:  Medicines   Take, use, or apply over-the-counter and prescription medicines only as told by your health care provider. These may include nasal sprays.   If you were prescribed an antibiotic medicine, take it as told by your health care provider. Do not stop taking the antibiotic even if you start to feel better.  Hydrate and humidify     Drink enough fluid to keep your urine pale yellow. Staying hydrated will help to thin your mucus.   Use a cool mist humidifier to keep the humidity level in your home above 50%.   Inhale steam for 10-15 minutes, 3-4 times a day, or as told by your health care provider. You can do this in the bathroom while a hot shower is  running.   Limit your exposure to cool or dry air.  Rest   Rest as much as possible.   Sleep with your head raised (elevated).   Make sure you get enough sleep each night.  General instructions     Apply a warm, moist washcloth to your face 3-4 times a day or as told by your health care provider. This will help with discomfort.   Wash your hands often with soap and water to reduce your exposure to germs. If soap and water are not available, use hand sanitizer.   Do not smoke. Avoid being around people who are smoking (secondhand smoke).   Keep all follow-up visits as told by your health care provider. This is important.  Contact a health care provider if:   You have a fever.   Your symptoms get worse.   Your symptoms do not improve within 10 [place].  Get help right away if:   You have a severe headache.   You have persistent vomiting.   You have severe pain or swelling around your face or eyes.   You have vision problems.   You develop confusion.   Your neck is stiff.   You have trouble breathing.  Summary   Sinusitis is soreness and inflammation of your sinuses. Sinuses are hollow spaces in the bones around your face.   This condition is caused by nasal tissues that become inflamed or swollen. The swelling traps or blocks the flow of mucus. This allows bacteria, viruses, and fungi to grow, which leads to infection.   If you were prescribed an antibiotic medicine, take it as told by your health care provider. Do not stop taking the antibiotic even if you start to feel better.   Keep all follow-up visits as told by your health care provider. This is important.  This information is not intended to replace advice given to you by your health care provider. Make sure you discuss any questions you have with your health care provider.  Document Released: 06/11/2005 Document Revised: 11/11/2017 Document Reviewed: 11/11/2017  Elsevier Interactive Patient Education  2019 [employer]

## 2018-09-05 NOTE — Progress Notes (Signed)
Patient ID: Lauren Jones, female    DOB: 11-02-1965  Age: 53 y.o. MRN: [id number]    Subjective:  Subjective  HPI Lauren Jones presents for cough and sinus pressure/ congestion since Monday.  She completely recovered from the last ov then symptoms started back   Review of Systems  Constitutional: Positive for chills. Negative for fever.  HENT: Positive for congestion, postnasal drip, rhinorrhea and sinus pressure.   Respiratory: Positive for cough, chest tightness, shortness of breath and wheezing.   Cardiovascular: Negative for chest pain, palpitations and leg swelling.  Allergic/Immunologic: Negative for environmental allergies.    History Past Medical History:  Diagnosis Date  . Hypothyroidism     She has a past surgical history that includes Thyroidectomy (Bilateral, 2014-2015?).   Her family history includes Colon cancer in her paternal grandmother; Hypertension in her father and mother; Sarcoidosis in her father.She reports that she has quit smoking. Her smoking use included cigarettes. She has never used smokeless tobacco. She reports current alcohol use. She reports that she does not use drugs.  Current Outpatient Medications on File Prior to Visit  Medication Sig Dispense Refill  . albuterol (PROVENTIL HFA;VENTOLIN HFA) 108 (90 Base) MCG/ACT inhaler Inhale 2 puffs into the lungs every 6 (six) hours as needed for wheezing or shortness of breath. 1 Inhaler 2  . benzonatate (TESSALON) 100 MG capsule Take 1 capsule (100 mg total) by mouth 3 (three) times daily as needed for cough. 30 capsule 0  . Cholecalciferol (VITAMIN D3) 1000 units CAPS Take 1 capsule (1,000 Units total) by mouth daily. 90 capsule 1  . cyclobenzaprine (FLEXERIL) 10 MG tablet Take 1 tablet (10 mg total) by mouth daily. 30 tablet 1  . hydrochlorothiazide (HYDRODIURIL) 25 MG tablet Take 1 tablet (25 mg total) by mouth daily. 90 tablet 3  . levothyroxine (SYNTHROID) 175 MCG tablet Take 1 tablet by mouth daily.     No  current facility-administered medications on file prior to visit.      Objective:  Objective  Physical Exam Vitals signs and nursing note reviewed.  Constitutional:      Appearance: She is well-developed.  HENT:     Head: Normocephalic and atraumatic.     Right Ear: External ear normal.     Left Ear: External ear normal.     Nose: Mucosal edema and congestion present.     Right Turbinates: Swollen.     Left Turbinates: Swollen.     Right Sinus: Maxillary sinus tenderness and frontal sinus tenderness present.     Left Sinus: Maxillary sinus tenderness and frontal sinus tenderness present.  Eyes:     General:        Right eye: No discharge.        Left eye: No discharge.     Conjunctiva/sclera: Conjunctivae normal.  Neck:     Musculoskeletal: Normal range of motion and neck supple.     Thyroid: No thyromegaly.     Vascular: No carotid bruit or JVD.  Cardiovascular:     Rate and Rhythm: Normal rate and regular rhythm.     Heart sounds: Normal heart sounds. No murmur.  Pulmonary:     Effort: Pulmonary effort is normal. No respiratory distress.     Breath sounds: Normal breath sounds and air entry. No decreased air movement. No wheezing or rales.  Chest:     Chest wall: No tenderness.  Lymphadenopathy:     Cervical: Cervical adenopathy present.  Neurological:     Mental Status:  She is alert and oriented to person, place, and time.    BP 122/84 (BP Location: Left Arm, Patient Position: Sitting, Cuff Size: Normal)   Pulse 60   Temp 98.6 F (37 C)   Resp 12   Ht 5\' 6"  (1.676 m)   Wt 233 lb (105.7 kg)   SpO2 97%   BMI 37.61 kg/m  Wt Readings from Last 3 Encounters:  09/05/18 233 lb (105.7 kg)  08/15/18 230 lb (104.3 kg)  08/18/18 230 lb (104.3 kg)     Lab Results  Component Value Date   WBC 6.3 03/24/2018   HGB 11.6 (L) 03/24/2018   HCT 35.8 03/24/2018   PLT 315 03/24/2018   GLUCOSE 97 06/06/2018   CHOL 168 03/24/2018   TRIG 119 03/24/2018   HDL 43 (L)  03/24/2018   LDLCALC 103 (H) 03/24/2018   ALT 17 06/06/2018   AST 18 06/06/2018   NA 139 06/06/2018   K 4.2 06/06/2018   CL 103 06/06/2018   CREATININE 0.85 06/06/2018   BUN 9 06/06/2018   CO2 31 06/06/2018   TSH 5.54 (H) 03/24/2018   HGBA1C 5.8 (H) 03/24/2018    No results found.   Assessment & Plan:  Plan  I have discontinued Aronda Linsley's Vitamin D (Ergocalciferol) and azithromycin. I am also having her start on levofloxacin, fluticasone, and predniSONE. Additionally, I am having her maintain her levothyroxine, cyclobenzaprine, Vitamin D3, hydrochlorothiazide, benzonatate, and albuterol.  Meds ordered this encounter  Medications  . levofloxacin (LEVAQUIN) 500 MG tablet    Sig: Take 1 tablet (500 mg total) by mouth daily.    Dispense:  7 tablet    Refill:  0  . fluticasone (FLONASE) 50 MCG/ACT nasal spray    Sig: Place 2 sprays into both nostrils daily.    Dispense:  16 g    Refill:  6  . predniSONE (DELTASONE) 10 MG tablet    Sig: TAKE 3 TABLETS PO QD FOR 3 [place] THEN TAKE 2 TABLETS PO QD FOR 3 [place] THEN TAKE 1 TABLET PO QD FOR 3 [place] THEN TAKE 1/2 TAB PO QD FOR 3 [place]    Dispense:  20 tablet    Refill:  0    Problem List Items Addressed This Visit    None    Visit Diagnoses    Acute non-recurrent pansinusitis    -  Primary   Relevant Medications   levofloxacin (LEVAQUIN) 500 MG tablet   fluticasone (FLONASE) 50 MCG/ACT nasal spray   predniSONE (DELTASONE) 10 MG tablet      Follow-up: Return if symptoms worsen or fail to improve.  Donato Schultz, DO

## 2018-09-09 ENCOUNTER — Ambulatory Visit: Payer: Self-pay | Admitting: *Deleted

## 2018-09-09 NOTE — Telephone Encounter (Signed)
Summary: Flu like symptoms   Pt stated she has been sick since December and was put on Prednisone and has gotten sick again. She has had flu like symptoms but at the present is only experiencing a cough. She is very adamant about being tested and wants to speak with a nurse about getting that done as she feels she has a right to do so. Please advise.     Patient has had several flu tests ( all negative)- patient has been treated several times for flu like symptoms. 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 Patient just wants to make sure she is not putting her client at risk. Patient is supposed to return to work on Thursday- and will need her note releasing her to do that. Patient states office can call her to let her know that letter is ready for her to pick up.

## 2018-09-09 NOTE — Telephone Encounter (Signed)
Does she have a fever 100.5 or higher?  If no fever she does not need to be tested --- unless she is taking tylenol , ibuprofen regularly

## 2018-09-09 NOTE — Telephone Encounter (Signed)
LMOM informing to return call. Okay for PEC to discuss.

## 2018-09-09 NOTE — Telephone Encounter (Signed)
Pt returned call. States afebrile, is not taking tylenol or ibuprofen. Verbalizes understanding of Dr. Maryln Gottron message.

## 2018-09-09 NOTE — Telephone Encounter (Signed)
Patient called, left VM to return call to the office for a message from the provider. 

## 2018-09-11 ENCOUNTER — Telehealth: Payer: Self-pay | Admitting: *Deleted

## 2018-09-11 ENCOUNTER — Encounter: Payer: Self-pay | Admitting: *Deleted

## 2018-09-11 NOTE — Telephone Encounter (Signed)
Copied from CRM [id number]. Topic: General - Inquiry >> Sep 11, 2018 11:47 AM Crist Infante wrote: Reason for CRM: pt states she requested a dr note stating she is cleared to go back to work. Pt would like to pick up today.  I do not see a request, but she states you know all about it.

## 2018-09-11 NOTE — Telephone Encounter (Signed)
Patient came into office and note given.

## 2018-09-18 ENCOUNTER — Ambulatory Visit: Payer: BLUE CROSS/BLUE SHIELD

## 2018-09-19 ENCOUNTER — Other Ambulatory Visit: Payer: Self-pay

## 2018-09-19 ENCOUNTER — Ambulatory Visit (INDEPENDENT_AMBULATORY_CARE_PROVIDER_SITE_OTHER): Payer: BLUE CROSS/BLUE SHIELD

## 2018-09-19 DIAGNOSIS — E538 Deficiency of other specified B group vitamins: Secondary | ICD-10-CM | POA: Diagnosis not present

## 2018-09-19 MED ORDER — CYANOCOBALAMIN 1000 MCG/ML IJ SOLN
1000.0000 ug | Freq: Once | INTRAMUSCULAR | Status: AC
  Administered 2018-09-19: 1000 ug via INTRAMUSCULAR

## 2018-09-19 NOTE — Progress Notes (Addendum)
Pt here for monthly B12 injection per Dr. Zola Button  B12 given IM, and pt tolerated injection well.  Next B12 injection scheduled for 10/24/2018.   Donato Schultz, DO

## 2018-10-02 ENCOUNTER — Telehealth: Payer: Self-pay | Admitting: *Deleted

## 2018-10-02 NOTE — Telephone Encounter (Signed)
Spoke with pt about scheduling a Virtual Visit for her 6 month f/u on 10/11/18. Pt states she feels overwhelmed with all of the Virtual work she is currently doing and would like to wait for OV when things settle down. Pt has cpe scheduled for 10/31/18 and will leave that as scheduled for now. She is aware that it may need to be rescheduled due to the current pandemic. Advised her that PCP is currently booking into end of July early August for CPE at this time and if she does have to r/s current cpe that she should strongly consider scheduling a Virtual Visit for her blood pressure follow up. Pt voices understanding.

## 2018-10-10 ENCOUNTER — Encounter: Payer: BLUE CROSS/BLUE SHIELD | Admitting: Family Medicine

## 2018-10-24 ENCOUNTER — Ambulatory Visit: Payer: BLUE CROSS/BLUE SHIELD

## 2018-10-28 ENCOUNTER — Telehealth: Payer: Self-pay | Admitting: *Deleted

## 2018-10-28 NOTE — Telephone Encounter (Signed)
Left message on machine to see if she have same ins (bcbs) and if so we can do virtual visit for cpe.

## 2018-10-31 ENCOUNTER — Ambulatory Visit (INDEPENDENT_AMBULATORY_CARE_PROVIDER_SITE_OTHER): Payer: BLUE CROSS/BLUE SHIELD

## 2018-10-31 ENCOUNTER — Ambulatory Visit (INDEPENDENT_AMBULATORY_CARE_PROVIDER_SITE_OTHER): Payer: BLUE CROSS/BLUE SHIELD | Admitting: Family Medicine

## 2018-10-31 ENCOUNTER — Encounter: Payer: Self-pay | Admitting: Family Medicine

## 2018-10-31 ENCOUNTER — Other Ambulatory Visit: Payer: Self-pay

## 2018-10-31 DIAGNOSIS — E538 Deficiency of other specified B group vitamins: Secondary | ICD-10-CM | POA: Diagnosis not present

## 2018-10-31 DIAGNOSIS — Z1239 Encounter for other screening for malignant neoplasm of breast: Secondary | ICD-10-CM

## 2018-10-31 DIAGNOSIS — Z0000 Encounter for general adult medical examination without abnormal findings: Secondary | ICD-10-CM

## 2018-10-31 MED ORDER — CYANOCOBALAMIN 1000 MCG/ML IJ SOLN
1000.0000 ug | Freq: Once | INTRAMUSCULAR | Status: AC
  Administered 2018-10-31: 1000 ug via INTRAMUSCULAR

## 2018-10-31 NOTE — Progress Notes (Signed)
Virtual Visit via Video Note  I connected with Asheton Forehand on 10/31/18 at  3:00 PM EDT by a video enabled telemedicine application and verified that I am speaking with the correct person using two identifiers.  Location: Patient: home  Provider: office    I discussed the limitations of evaluation and management by telemedicine and the availability of in person appointments. The patient expressed understanding and agreed to proceed.  History of Present Illness: Pt is here for cpe.  She will come in later for labs and gyn exam.  No complaints.    Past Medical History:  Diagnosis Date  . Hypothyroidism    Current Outpatient Medications on File Prior to Visit  Medication Sig Dispense Refill  . albuterol (PROVENTIL HFA;VENTOLIN HFA) 108 (90 Base) MCG/ACT inhaler Inhale 2 puffs into the lungs every 6 (six) hours as needed for wheezing or shortness of breath. 1 Inhaler 2  . benzonatate (TESSALON) 100 MG capsule Take 1 capsule (100 mg total) by mouth 3 (three) times daily as needed for cough. 30 capsule 0  . Cholecalciferol (VITAMIN D3) 1000 units CAPS Take 1 capsule (1,000 Units total) by mouth daily. 90 capsule 1  . cyclobenzaprine (FLEXERIL) 10 MG tablet Take 1 tablet (10 mg total) by mouth daily. 30 tablet 1  . fluticasone (FLONASE) 50 MCG/ACT nasal spray Place 2 sprays into both nostrils daily. 16 g 6  . hydrochlorothiazide (HYDRODIURIL) 25 MG tablet Take 1 tablet (25 mg total) by mouth daily. 90 tablet 3  . levofloxacin (LEVAQUIN) 500 MG tablet Take 1 tablet (500 mg total) by mouth daily. 7 tablet 0  . levothyroxine (SYNTHROID) 175 MCG tablet Take 1 tablet by mouth daily.     No current facility-administered medications on file prior to visit.    Social History   Socioeconomic History  . Marital status: Single    Spouse name: Not on file  . Number of children: Not on file  . Years of education: Not on file  . Highest education level: Not on file  Occupational History  . Not on file   Social Needs  . Financial resource strain: Not on file  . Food insecurity:    Worry: Not on file    Inability: Not on file  . Transportation needs:    Medical: Not on file    Non-medical: Not on file  Tobacco Use  . Smoking status: Former Smoker    Types: Cigarettes  . Smokeless tobacco: Never Used  Substance and Sexual Activity  . Alcohol use: Yes    Comment: social  . Drug use: Never  . Sexual activity: Not on file  Lifestyle  . Physical activity:    Bluitt per week: Not on file    Minutes per session: Not on file  . Stress: Not on file  Relationships  . Social connections:    Talks on phone: Not on file    Gets together: Not on file    Attends religious service: Not on file    Active member of club or organization: Not on file    Attends meetings of clubs or organizations: Not on file    Relationship status: Not on file  . Intimate partner violence:    Fear of current or ex partner: Not on file    Emotionally abused: Not on file    Physically abused: Not on file    Forced sexual activity: Not on file  Other Topics Concern  . Not on file  Social History  Narrative  . Not on file      Observations/Objective Afebrile ,  233, 5',6" Pt is in NAD Skin -- no rashes  Psych-- no anxiety, depression  Assessment and Plan: 1. Preventative health care Check labs  [facility]: Order for colon put in  Pt will come in for pap in 1-2 months mammo order also put in  - Ambulatory referral to Gastroenterology - CBC with Differential/Platelet; Future - Lipid panel; Future - TSH; Future - Comprehensive metabolic panel; Future - B12 and Folate Panel; Future - HIV Antibody (routine testing w rflx); Future  2. B12 deficiency Injection given today Check labs  - B12 and Folate Panel; Future  3. Breast cancer screening   - MM DIGITAL SCREENING BILATERAL; Future   Follow Up Instructions:    I discussed the assessment and treatment plan with the patient. The patient was  provided an opportunity to ask questions and all were answered. The patient agreed with the plan and demonstrated an understanding of the instructions.   The patient was advised to call back or seek an in-person evaluation if the symptoms worsen or if the condition fails to improve as anticipated.  I provided 30 minutes of non-face-to-face time during this encounter.   Donato SchultzYvonne R Lowne Chase, DO

## 2018-10-31 NOTE — Progress Notes (Signed)
Pre visit review using our clinic tool,if applicable. No additional management support is needed unless otherwise documented below in the visit note.   Pt here for monthly B12 injection per order from Dr. Zola Button.  B12 given IM Right arm, and pt tolerated injection well.  Next B12 injection scheduled for 1 month. Patient aware.

## 2018-10-31 NOTE — Progress Notes (Signed)
Noted  Yvonne R Lowne Chase, DO  

## 2018-11-07 ENCOUNTER — Other Ambulatory Visit (INDEPENDENT_AMBULATORY_CARE_PROVIDER_SITE_OTHER): Payer: BLUE CROSS/BLUE SHIELD

## 2018-11-07 ENCOUNTER — Other Ambulatory Visit: Payer: Self-pay

## 2018-11-07 DIAGNOSIS — E538 Deficiency of other specified B group vitamins: Secondary | ICD-10-CM

## 2018-11-07 DIAGNOSIS — Z0000 Encounter for general adult medical examination without abnormal findings: Secondary | ICD-10-CM | POA: Diagnosis not present

## 2018-11-07 LAB — COMPREHENSIVE METABOLIC PANEL
ALT: 19 U/L (ref 0–35)
AST: 17 U/L (ref 0–37)
Albumin: 4.1 g/dL (ref 3.5–5.2)
Alkaline Phosphatase: 65 U/L (ref 39–117)
BUN: 10 mg/dL (ref 6–23)
CO2: 28 mEq/L (ref 19–32)
Calcium: 8.4 mg/dL (ref 8.4–10.5)
Chloride: 101 mEq/L (ref 96–112)
Creatinine, Ser: 0.87 mg/dL (ref 0.40–1.20)
GFR: 82.8 mL/min (ref >60.00)
Glucose, Bld: 142 mg/dL — ABNORMAL HIGH (ref 70–99)
Potassium: 3.9 mEq/L (ref 3.5–5.1)
Sodium: 138 mEq/L (ref 135–145)
Total Bilirubin: 0.4 mg/dL (ref 0.2–1.2)
Total Protein: 6.8 g/dL (ref 6.0–8.3)

## 2018-11-07 LAB — B12 AND FOLATE PANEL
Folate: 14.8 ng/mL (ref >5.9)
Vitamin B-12: 604 pg/mL (ref 211–911)

## 2018-11-07 LAB — LIPID PANEL
Cholesterol: 197 mg/dL (ref 0–200)
HDL: 59.9 mg/dL (ref >39.00)
LDL Cholesterol: 105 mg/dL — ABNORMAL HIGH (ref 0–99)
NonHDL: 137.31
Total CHOL/HDL Ratio: 3
Triglycerides: 163 mg/dL — ABNORMAL HIGH (ref 0.0–149.0)
VLDL: 32.6 mg/dL (ref 0.0–40.0)

## 2018-11-07 LAB — CBC WITH DIFFERENTIAL/PLATELET
Basophils Absolute: 0.1 10*3/uL (ref 0.0–0.1)
Basophils Relative: 1.2 % (ref 0.0–3.0)
Eosinophils Absolute: 0.2 10*3/uL (ref 0.0–0.7)
Eosinophils Relative: 2.9 % (ref 0.0–5.0)
HCT: 37.1 % (ref 36.0–46.0)
Hemoglobin: 12.4 g/dL (ref 12.0–15.0)
Lymphocytes Relative: 29 % (ref 12.0–46.0)
Lymphs Abs: 2 10*3/uL (ref 0.7–4.0)
MCHC: 33.4 g/dL (ref 30.0–36.0)
MCV: 83.1 fl (ref 78.0–100.0)
Monocytes Absolute: 0.4 10*3/uL (ref 0.1–1.0)
Monocytes Relative: 5.5 % (ref 3.0–12.0)
Neutro Abs: 4.3 10*3/uL (ref 1.4–7.7)
Neutrophils Relative %: 61.4 % (ref 43.0–77.0)
Platelets: 264 10*3/uL (ref 150.0–400.0)
RBC: 4.47 Mil/uL (ref 3.87–5.11)
RDW: 16 % — ABNORMAL HIGH (ref 11.5–15.5)
WBC: 7.1 10*3/uL (ref 4.0–10.5)

## 2018-11-07 LAB — TSH: TSH: 45.98 u[IU]/mL — ABNORMAL HIGH (ref 0.35–4.50)

## 2018-11-08 LAB — HIV ANTIBODY (ROUTINE TESTING W REFLEX): HIV 1&2 Ab, 4th Generation: NONREACTIVE

## 2018-11-11 ENCOUNTER — Other Ambulatory Visit: Payer: Self-pay | Admitting: Family Medicine

## 2018-11-11 DIAGNOSIS — E079 Disorder of thyroid, unspecified: Secondary | ICD-10-CM

## 2018-11-28 ENCOUNTER — Ambulatory Visit: Payer: BC Managed Care – PPO

## 2018-12-19 ENCOUNTER — Other Ambulatory Visit: Payer: Self-pay

## 2018-12-22 ENCOUNTER — Other Ambulatory Visit: Payer: Self-pay

## 2018-12-22 ENCOUNTER — Ambulatory Visit (INDEPENDENT_AMBULATORY_CARE_PROVIDER_SITE_OTHER): Payer: BC Managed Care – PPO | Admitting: Family Medicine

## 2018-12-22 ENCOUNTER — Encounter: Payer: Self-pay | Admitting: Family Medicine

## 2018-12-22 VITALS — BP 122/71 | HR 56 | Temp 98.0°F | Resp 18 | Ht 66.0 in | Wt 238.0 lb

## 2018-12-22 DIAGNOSIS — E890 Postprocedural hypothyroidism: Secondary | ICD-10-CM

## 2018-12-22 DIAGNOSIS — R739 Hyperglycemia, unspecified: Secondary | ICD-10-CM | POA: Diagnosis not present

## 2018-12-22 DIAGNOSIS — R600 Localized edema: Secondary | ICD-10-CM | POA: Diagnosis not present

## 2018-12-22 DIAGNOSIS — E039 Hypothyroidism, unspecified: Secondary | ICD-10-CM

## 2018-12-22 MED ORDER — LEVOTHYROXINE SODIUM 175 MCG PO TABS: 175.0000 ug | ORAL_TABLET | Freq: Every day | ORAL | 1 refills | Status: DC

## 2018-12-22 NOTE — Assessment & Plan Note (Signed)
Elevate low ext Con' t diuretic Compression socks

## 2018-12-22 NOTE — Assessment & Plan Note (Signed)
Take the synthroid daily F/u endo  Recheck labs

## 2018-12-22 NOTE — Patient Instructions (Signed)

## 2018-12-22 NOTE — Assessment & Plan Note (Signed)
Watch simple sugars and starches Check  Labs

## 2018-12-22 NOTE — Progress Notes (Signed)
Patient ID: Lauren Jones, female    DOB: 1965-07-18  Age: 53 y.o. MRN: [id number]    Subjective:  Subjective  HPI Lauren Jones presents for c/o swelling in anklse that is better now but still worse at the end of the day.  She is also concerned about her blood sugars.  Her last sugar was elevated and she has a family hx of dm.   Pt never did f/u with endo and has not been very good about taking her synthroid No other complaints.    Review of Systems  Constitutional: Negative for activity change, appetite change, chills, fatigue, fever and unexpected weight change.  HENT: Negative for congestion and hearing loss.   Eyes: Negative for discharge.  Respiratory: Negative for cough and shortness of breath.   Cardiovascular: Negative for chest pain, palpitations and leg swelling.  Gastrointestinal: Negative for abdominal pain, blood in stool, constipation, diarrhea, nausea and vomiting.  Genitourinary: Negative for dysuria, frequency, hematuria and urgency.  Musculoskeletal: Negative for back pain and myalgias.  Skin: Negative for rash.  Allergic/Immunologic: Negative for environmental allergies.  Neurological: Negative for dizziness, weakness and headaches.  Hematological: Does not bruise/bleed easily.  Psychiatric/Behavioral: Negative for behavioral problems, dysphoric mood and suicidal ideas. The patient is not nervous/anxious.     History Past Medical History:  Diagnosis Date  . Hypothyroidism     She has a past surgical history that includes Thyroidectomy (Bilateral, 2014-2015?).   Her family history includes Colon cancer in her paternal grandmother; Hypertension in her father and mother; Sarcoidosis in her father.She reports that she has quit smoking. Her smoking use included cigarettes. She has never used smokeless tobacco. She reports current alcohol use. She reports that she does not use drugs.  Current Outpatient Medications on File Prior to Visit  Medication Sig Dispense Refill  .  albuterol (PROVENTIL HFA;VENTOLIN HFA) 108 (90 Base) MCG/ACT inhaler Inhale 2 puffs into the lungs every 6 (six) hours as needed for wheezing or shortness of breath. 1 Inhaler 2  . benzonatate (TESSALON) 100 MG capsule Take 1 capsule (100 mg total) by mouth 3 (three) times daily as needed for cough. 30 capsule 0  . Cholecalciferol (VITAMIN D3) 1000 units CAPS Take 1 capsule (1,000 Units total) by mouth daily. 90 capsule 1  . cyclobenzaprine (FLEXERIL) 10 MG tablet Take 1 tablet (10 mg total) by mouth daily. 30 tablet 1  . fluticasone (FLONASE) 50 MCG/ACT nasal spray Place 2 sprays into both nostrils daily. 16 g 6  . hydrochlorothiazide (HYDRODIURIL) 25 MG tablet Take 1 tablet (25 mg total) by mouth daily. 90 tablet 3  . levofloxacin (LEVAQUIN) 500 MG tablet Take 1 tablet (500 mg total) by mouth daily. 7 tablet 0   No current facility-administered medications on file prior to visit.      Objective:  Objective  Physical Exam Vitals signs and nursing note reviewed. Exam conducted with a chaperone present.  Constitutional:      Appearance: She is well-developed.  HENT:     Head: Normocephalic and atraumatic.  Eyes:     Conjunctiva/sclera: Conjunctivae normal.  Neck:     Musculoskeletal: Normal range of motion and neck supple.     Thyroid: No thyromegaly.     Vascular: No carotid bruit or JVD.  Cardiovascular:     Rate and Rhythm: Normal rate and regular rhythm.     Heart sounds: Normal heart sounds. No murmur.  Pulmonary:     Effort: Pulmonary effort is normal. No respiratory distress.  Breath sounds: Normal breath sounds. No wheezing or rales.  Chest:     Chest wall: No tenderness.  Musculoskeletal:        General: Swelling present. No tenderness, deformity or signs of injury.     Right lower leg: Edema present.     Left lower leg: Edema present.       Legs:  Neurological:     Mental Status: She is alert and oriented to person, place, and time.    BP 122/71 (BP Location:  Left Arm, Patient Position: Sitting, Cuff Size: Large)   Pulse (!) 56   Temp 98 F (36.7 C) (Oral)   Resp 18   Ht 5\' 6"  (1.676 m)   Wt 238 lb (108 kg)   SpO2 99%   BMI 38.41 kg/m  Wt Readings from Last 3 Encounters:  12/22/18 238 lb (108 kg)  09/05/18 233 lb (105.7 kg)  08/15/18 230 lb (104.3 kg)     Lab Results  Component Value Date   WBC 7.1 11/07/2018   HGB 12.4 11/07/2018   HCT 37.1 11/07/2018   PLT 264.0 11/07/2018   GLUCOSE 142 (H) 11/07/2018   CHOL 197 11/07/2018   TRIG 163.0 (H) 11/07/2018   HDL 59.90 11/07/2018   LDLCALC 105 (H) 11/07/2018   ALT 19 11/07/2018   AST 17 11/07/2018   NA 138 11/07/2018   K 3.9 11/07/2018   CL 101 11/07/2018   CREATININE 0.87 11/07/2018   BUN 10 11/07/2018   CO2 28 11/07/2018   TSH 45.98 (H) 11/07/2018   HGBA1C 5.8 (H) 03/24/2018    No results found.   Assessment & Plan:  Plan  I have changed Lauren Jones's levothyroxine. I am also having her maintain her cyclobenzaprine, Vitamin D3, hydrochlorothiazide, benzonatate, albuterol, levofloxacin, and fluticasone.  Meds ordered this encounter  Medications  . levothyroxine (SYNTHROID) 175 MCG tablet    Sig: Take 1 tablet (175 mcg total) by mouth daily.    Dispense:  90 tablet    Refill:  1    Problem List Items Addressed This Visit      Unprioritized   Hyperglycemia    Watch simple sugars and starches Check  Labs       Relevant Orders   Hemoglobin A1c   Lipid panel   Comprehensive metabolic panel   Lower extremity edema    Elevate low ext Con' t diuretic Compression socks       Relevant Orders   Comprehensive metabolic panel   Post-surgical hypothyroidism    Take the synthroid daily F/u endo  Recheck labs       Relevant Medications   levothyroxine (SYNTHROID) 175 MCG tablet    Other Visit Diagnoses    Hypothyroidism, unspecified type    -  Primary   Relevant Medications   levothyroxine (SYNTHROID) 175 MCG tablet   Other Relevant Orders   Thyroid Panel  With TSH      Follow-up: Return in about 2 months (around 02/21/2019), or if symptoms worsen or fail to improve, for f/u thyroid .  Ann Held, DO

## 2018-12-23 LAB — COMPREHENSIVE METABOLIC PANEL
ALT: 17 U/L (ref 0–35)
AST: 17 U/L (ref 0–37)
Albumin: 4.2 g/dL (ref 3.5–5.2)
Alkaline Phosphatase: 65 U/L (ref 39–117)
BUN: 12 mg/dL (ref 6–23)
CO2: 29 mEq/L (ref 19–32)
Calcium: 9 mg/dL (ref 8.4–10.5)
Chloride: 99 mEq/L (ref 96–112)
Creatinine, Ser: 0.96 mg/dL (ref 0.40–1.20)
GFR: 73.87 mL/min (ref >60.00)
Glucose, Bld: 78 mg/dL (ref 70–99)
Potassium: 3.6 mEq/L (ref 3.5–5.1)
Sodium: 137 mEq/L (ref 135–145)
Total Bilirubin: 0.4 mg/dL (ref 0.2–1.2)
Total Protein: 6.9 g/dL (ref 6.0–8.3)

## 2018-12-23 LAB — LIPID PANEL
Cholesterol: 188 mg/dL (ref 0–200)
HDL: 50.5 mg/dL (ref >39.00)
LDL Cholesterol: 110 mg/dL — ABNORMAL HIGH (ref 0–99)
NonHDL: 137.76
Total CHOL/HDL Ratio: 4
Triglycerides: 137 mg/dL (ref 0.0–149.0)
VLDL: 27.4 mg/dL (ref 0.0–40.0)

## 2018-12-23 LAB — THYROID PANEL WITH TSH
Free Thyroxine Index: 1.4 (ref 1.4–3.8)
T3 Uptake: 18 % — ABNORMAL LOW (ref 22–35)
T4, Total: 7.7 ug/dL (ref 5.1–11.9)
TSH: 34.88 mIU/L — ABNORMAL HIGH

## 2018-12-23 LAB — HEMOGLOBIN A1C: Hgb A1c MFr Bld: 5.8 % (ref 4.6–6.5)

## 2018-12-30 ENCOUNTER — Other Ambulatory Visit: Payer: Self-pay

## 2018-12-30 MED ORDER — LEVOTHYROXINE SODIUM 200 MCG PO TABS: 200.0000 ug | ORAL_TABLET | Freq: Every day | ORAL | 2 refills | Status: DC

## 2019-01-13 DIAGNOSIS — R5383 Other fatigue: Secondary | ICD-10-CM | POA: Diagnosis not present

## 2019-01-13 DIAGNOSIS — E042 Nontoxic multinodular goiter: Secondary | ICD-10-CM | POA: Diagnosis not present

## 2019-01-30 DIAGNOSIS — L219 Seborrheic dermatitis, unspecified: Secondary | ICD-10-CM | POA: Diagnosis not present

## 2019-01-30 DIAGNOSIS — L719 Rosacea, unspecified: Secondary | ICD-10-CM | POA: Diagnosis not present

## 2019-02-03 ENCOUNTER — Ambulatory Visit: Payer: BLUE CROSS/BLUE SHIELD | Admitting: Family Medicine

## 2019-02-24 ENCOUNTER — Other Ambulatory Visit: Payer: Self-pay

## 2019-02-24 ENCOUNTER — Ambulatory Visit (INDEPENDENT_AMBULATORY_CARE_PROVIDER_SITE_OTHER): Payer: BC Managed Care – PPO | Admitting: Family Medicine

## 2019-02-24 ENCOUNTER — Encounter: Payer: Self-pay | Admitting: Family Medicine

## 2019-02-24 VITALS — BP 118/70 | HR 58 | Temp 96.8°F | Resp 12 | Ht 66.0 in | Wt 234.2 lb

## 2019-02-24 DIAGNOSIS — E039 Hypothyroidism, unspecified: Secondary | ICD-10-CM

## 2019-02-24 DIAGNOSIS — E042 Nontoxic multinodular goiter: Secondary | ICD-10-CM | POA: Diagnosis not present

## 2019-02-24 DIAGNOSIS — I10 Essential (primary) hypertension: Secondary | ICD-10-CM | POA: Diagnosis not present

## 2019-02-24 DIAGNOSIS — E538 Deficiency of other specified B group vitamins: Secondary | ICD-10-CM

## 2019-02-24 LAB — CBC WITH DIFFERENTIAL/PLATELET
Basophils Absolute: 0.1 10*3/uL (ref 0.0–0.1)
Basophils Relative: 1 % (ref 0.0–3.0)
Eosinophils Absolute: 0.2 10*3/uL (ref 0.0–0.7)
Eosinophils Relative: 3.1 % (ref 0.0–5.0)
HCT: 37 % (ref 36.0–46.0)
Hemoglobin: 12.2 g/dL (ref 12.0–15.0)
Lymphocytes Relative: 35 % (ref 12.0–46.0)
Lymphs Abs: 2.7 10*3/uL (ref 0.7–4.0)
MCHC: 32.9 g/dL (ref 30.0–36.0)
MCV: 82.6 fl (ref 78.0–100.0)
Monocytes Absolute: 0.6 10*3/uL (ref 0.1–1.0)
Monocytes Relative: 8.2 % (ref 3.0–12.0)
Neutro Abs: 4.1 10*3/uL (ref 1.4–7.7)
Neutrophils Relative %: 52.7 % (ref 43.0–77.0)
Platelets: 317 10*3/uL (ref 150.0–400.0)
RBC: 4.47 Mil/uL (ref 3.87–5.11)
RDW: 14.3 % (ref 11.5–15.5)
WBC: 7.7 10*3/uL (ref 4.0–10.5)

## 2019-02-24 LAB — THYROID PANEL WITH TSH
Free Thyroxine Index: 5.1 — ABNORMAL HIGH (ref 1.4–3.8)
T3 Uptake: 30 % (ref 22–35)
T4, Total: 17.1 ug/dL — ABNORMAL HIGH (ref 5.1–11.9)
TSH: 0.25 mIU/L — ABNORMAL LOW

## 2019-02-24 LAB — VITAMIN B12: Vitamin B-12: 446 pg/mL (ref 211–911)

## 2019-02-24 MED ORDER — CYANOCOBALAMIN 1000 MCG/ML IJ SOLN
1000.0000 ug | Freq: Once | INTRAMUSCULAR | Status: AC
  Administered 2019-02-24: 1000 ug via INTRAMUSCULAR

## 2019-02-24 NOTE — Patient Instructions (Signed)

## 2019-02-24 NOTE — Assessment & Plan Note (Signed)
F/u endo  

## 2019-02-24 NOTE — Assessment & Plan Note (Signed)
Well controlled, no changes to meds. Encouraged heart healthy diet such as the DASH diet and exercise as tolerated.  °

## 2019-02-24 NOTE — Assessment & Plan Note (Signed)
Cont synthroid Check labs  

## 2019-02-24 NOTE — Progress Notes (Signed)
Patient ID: Lauren Jones, female    DOB: Dec 27, 1965  Age: 53 y.o. MRN: [id number]    Subjective:  Subjective  HPI Lauren Jones presents for f/u thyroid and b12.  The swelling in her feet is much better  No other complaints   She missed her last couple of b12 shots and she can tell the difference  She is very tired  Review of Systems  Constitutional: Positive for fatigue. Negative for activity change, appetite change and unexpected weight change.  Respiratory: Negative for cough and shortness of breath.   Cardiovascular: Negative for chest pain and palpitations.  Psychiatric/Behavioral: Negative for behavioral problems and dysphoric mood. The patient is not nervous/anxious.     History Past Medical History:  Diagnosis Date  . Hypothyroidism     She has a past surgical history that includes Thyroidectomy (Bilateral, 2014-2015?).   Her family history includes Colon cancer in her paternal grandmother; Hypertension in her father and mother; Sarcoidosis in her father.She reports that she has quit smoking. Her smoking use included cigarettes. She has never used smokeless tobacco. She reports current alcohol use. She reports that she does not use drugs.  Current Outpatient Medications on File Prior to Visit  Medication Sig Dispense Refill  . albuterol (PROVENTIL HFA;VENTOLIN HFA) 108 (90 Base) MCG/ACT inhaler Inhale 2 puffs into the lungs every 6 (six) hours as needed for wheezing or shortness of breath. 1 Inhaler 2  . benzonatate (TESSALON) 100 MG capsule Take 1 capsule (100 mg total) by mouth 3 (three) times daily as needed for cough. 30 capsule 0  . Cholecalciferol (VITAMIN D3) 1000 units CAPS Take 1 capsule (1,000 Units total) by mouth daily. 90 capsule 1  . cyclobenzaprine (FLEXERIL) 10 MG tablet Take 1 tablet (10 mg total) by mouth daily. 30 tablet 1  . fluticasone (FLONASE) 50 MCG/ACT nasal spray Place 2 sprays into both nostrils daily. 16 g 6  . hydrochlorothiazide (HYDRODIURIL) 25 MG tablet  Take 1 tablet (25 mg total) by mouth daily. 90 tablet 3  . levofloxacin (LEVAQUIN) 500 MG tablet Take 1 tablet (500 mg total) by mouth daily. 7 tablet 0  . levothyroxine (SYNTHROID) 200 MCG tablet Take 1 tablet (200 mcg total) by mouth daily before breakfast. 30 tablet 2   No current facility-administered medications on file prior to visit.      Objective:  Objective  Physical Exam Vitals signs and nursing note reviewed.  Constitutional:      Appearance: She is well-developed.  HENT:     Head: Normocephalic and atraumatic.  Eyes:     Conjunctiva/sclera: Conjunctivae normal.  Neck:     Musculoskeletal: Normal range of motion and neck supple.     Thyroid: No thyromegaly.     Vascular: No carotid bruit or JVD.  Cardiovascular:     Rate and Rhythm: Normal rate and regular rhythm.     Heart sounds: Normal heart sounds. No murmur.  Pulmonary:     Effort: Pulmonary effort is normal. No respiratory distress.     Breath sounds: Normal breath sounds. No wheezing or rales.  Chest:     Chest wall: No tenderness.  Neurological:     Mental Status: She is alert and oriented to person, place, and time.    BP 118/70 (BP Location: Left Arm, Cuff Size: Large)   Pulse (!) 58   Temp (!) 96.8 F (36 C) (Temporal)   Resp 12   Ht 5\' 6"  (1.676 m)   Wt 234 lb 3.2 oz (  106.2 kg)   SpO2 98%   BMI 37.80 kg/m  Wt Readings from Last 3 Encounters:  02/24/19 234 lb 3.2 oz (106.2 kg)  12/22/18 238 lb (108 kg)  09/05/18 233 lb (105.7 kg)     Lab Results  Component Value Date   WBC 7.1 11/07/2018   HGB 12.4 11/07/2018   HCT 37.1 11/07/2018   PLT 264.0 11/07/2018   GLUCOSE 78 12/22/2018   CHOL 188 12/22/2018   TRIG 137.0 12/22/2018   HDL 50.50 12/22/2018   LDLCALC 110 (H) 12/22/2018   ALT 17 12/22/2018   AST 17 12/22/2018   NA 137 12/22/2018   K 3.6 12/22/2018   CL 99 12/22/2018   CREATININE 0.96 12/22/2018   BUN 12 12/22/2018   CO2 29 12/22/2018   TSH 34.88 (H) 12/22/2018   HGBA1C  5.8 12/22/2018    No results found.   Assessment & Plan:  Plan  I am having Lauren Jones maintain her cyclobenzaprine, Vitamin D3, hydrochlorothiazide, benzonatate, albuterol, levofloxacin, fluticasone, and levothyroxine. We administered cyanocobalamin.  Meds ordered this encounter  Medications  . cyanocobalamin ((VITAMIN B-12)) injection 1,000 mcg    Problem List Items Addressed This Visit      Unprioritized   B12 deficiency    Check labs today b12 given       Relevant Orders   CBC with Differential/Platelet   Vitamin B12   Essential hypertension    Well controlled, no changes to meds. Encouraged heart healthy diet such as the DASH diet and exercise as tolerated.       Hypothyroidism - Primary    Cont synthroid Check labs       Relevant Orders   Thyroid Panel With TSH   Multinodular non-toxic goiter    F/u endo         Follow-up: Return in about 8 months (around 10/24/2019), or if symptoms worsen or fail to improve, for annual exam, fasting.  Donato SchultzYvonne R Lowne Chase, DO

## 2019-02-24 NOTE — Assessment & Plan Note (Signed)
Check labs today b12 given

## 2019-03-31 ENCOUNTER — Ambulatory Visit: Payer: BC Managed Care – PPO

## 2019-04-02 ENCOUNTER — Ambulatory Visit (INDEPENDENT_AMBULATORY_CARE_PROVIDER_SITE_OTHER): Payer: BC Managed Care – PPO | Admitting: *Deleted

## 2019-04-02 ENCOUNTER — Other Ambulatory Visit: Payer: Self-pay

## 2019-04-02 DIAGNOSIS — E538 Deficiency of other specified B group vitamins: Secondary | ICD-10-CM | POA: Diagnosis not present

## 2019-04-02 MED ORDER — CYANOCOBALAMIN 1000 MCG/ML IJ SOLN
1000.0000 ug | Freq: Once | INTRAMUSCULAR | Status: AC
  Administered 2019-04-02: 1000 ug via INTRAMUSCULAR

## 2019-04-02 NOTE — Progress Notes (Signed)
Pt here for monthly B12 injection per order from Dr. Carollee Herter.  B12 1061mcg given IM Right arm, and pt tolerated injection well.  Next B12 injection scheduled for 1 month.

## 2019-05-05 ENCOUNTER — Ambulatory Visit: Payer: BC Managed Care – PPO

## 2019-06-04 DIAGNOSIS — L718 Other rosacea: Secondary | ICD-10-CM | POA: Diagnosis not present

## 2019-07-17 DIAGNOSIS — E890 Postprocedural hypothyroidism: Secondary | ICD-10-CM | POA: Diagnosis not present

## 2019-07-17 DIAGNOSIS — R5383 Other fatigue: Secondary | ICD-10-CM | POA: Diagnosis not present

## 2019-07-17 DIAGNOSIS — Z6836 Body mass index (BMI) 36.0-36.9, adult: Secondary | ICD-10-CM | POA: Diagnosis not present

## 2019-07-17 DIAGNOSIS — E669 Obesity, unspecified: Secondary | ICD-10-CM | POA: Diagnosis not present

## 2019-07-17 DIAGNOSIS — E039 Hypothyroidism, unspecified: Secondary | ICD-10-CM | POA: Diagnosis not present

## 2019-07-17 DIAGNOSIS — Z79899 Other long term (current) drug therapy: Secondary | ICD-10-CM | POA: Diagnosis not present

## 2019-07-30 ENCOUNTER — Other Ambulatory Visit: Payer: Self-pay

## 2019-07-31 ENCOUNTER — Encounter: Payer: Self-pay | Admitting: Family Medicine

## 2019-07-31 ENCOUNTER — Ambulatory Visit (INDEPENDENT_AMBULATORY_CARE_PROVIDER_SITE_OTHER): Payer: BC Managed Care – PPO | Admitting: Family Medicine

## 2019-07-31 ENCOUNTER — Other Ambulatory Visit: Payer: Self-pay

## 2019-07-31 VITALS — BP 122/56 | HR 57 | Temp 97.6°F | Resp 18 | Ht 66.0 in | Wt 231.4 lb

## 2019-07-31 DIAGNOSIS — R5383 Other fatigue: Secondary | ICD-10-CM

## 2019-07-31 DIAGNOSIS — E538 Deficiency of other specified B group vitamins: Secondary | ICD-10-CM | POA: Diagnosis not present

## 2019-07-31 DIAGNOSIS — N921 Excessive and frequent menstruation with irregular cycle: Secondary | ICD-10-CM | POA: Diagnosis not present

## 2019-07-31 DIAGNOSIS — J069 Acute upper respiratory infection, unspecified: Secondary | ICD-10-CM | POA: Diagnosis not present

## 2019-07-31 DIAGNOSIS — T148XXA Other injury of unspecified body region, initial encounter: Secondary | ICD-10-CM

## 2019-07-31 MED ORDER — BENZONATATE 100 MG PO CAPS: 100.0000 mg | ORAL_CAPSULE | Freq: Three times a day (TID) | ORAL | 0 refills | Status: DC | PRN

## 2019-07-31 MED ORDER — CYANOCOBALAMIN 1000 MCG/ML IJ SOLN
1000.0000 ug | INTRAMUSCULAR | Status: DC
  Administered 2019-07-31 – 2020-11-29 (×2): 1000 ug via INTRAMUSCULAR

## 2019-07-31 NOTE — Patient Instructions (Signed)
COVID-19 Vaccine Information can be found at: [email or web address] For questions related to vaccine distribution or appointments, please email [email or web address] or call [phone].    Vitamin B12 Deficiency Vitamin B12 deficiency occurs when the body does not have enough vitamin B12, which is an important vitamin. The body needs this vitamin:  To make red blood cells.  To make DNA. This is the genetic material inside cells.  To help the nerves work properly so they can carry messages from the brain to the body. Vitamin B12 deficiency can cause various health problems, such as a low red blood cell count (anemia) or nerve damage. What are the causes? This condition may be caused by:  Not eating enough foods that contain vitamin B12.  Not having enough stomach acid and digestive fluids to properly absorb vitamin B12 from the food that you eat.  Certain digestive system diseases that make it hard to absorb vitamin B12. These diseases include Crohn's disease, chronic pancreatitis, and cystic fibrosis.  A condition in which the body does not make enough of a protein (intrinsic factor), resulting in too few red blood cells (pernicious anemia).  Having a surgery in which part of the stomach or small intestine is removed.  Taking certain medicines that make it hard for the body to absorb vitamin B12. These medicines include: ? Heartburn medicines (antacids and proton pump inhibitors). ? Certain antibiotic medicines. ? Some medicines that are used to treat diabetes, tuberculosis, gout, or high cholesterol. What increases the risk? The following factors may make you more likely to develop a B12 deficiency:  Being older than age 19.  Eating a vegetarian or vegan diet, especially while you are pregnant.  Eating a poor diet while you are pregnant.  Taking certain medicines.  Having alcoholism. What are the signs or  symptoms? In some cases, there are no symptoms of this condition. If the condition leads to anemia or nerve damage, various symptoms can occur, such as:  Weakness.  Fatigue.  Loss of appetite.  Weight loss.  Numbness or tingling in your hands and feet.  Redness and burning of the tongue.  Confusion or memory problems.  Depression.  Sensory problems, such as color blindness, ringing in the ears, or loss of taste.  Diarrhea or constipation.  Trouble walking. If anemia is severe, symptoms can include:  Shortness of breath.  Dizziness.  Rapid heart rate (tachycardia). How is this diagnosed? This condition may be diagnosed with a blood test to measure the level of vitamin B12 in your blood. You may also have other tests, including:  A group of tests that measure certain characteristics of blood cells (complete blood count, CBC).  A blood test to measure intrinsic factor.  A procedure where a thin tube with a camera on the end is used to look into your stomach or intestines (endoscopy). Other tests may be needed to discover the cause of B12 deficiency. How is this treated? Treatment for this condition depends on the cause. This condition may be treated by:  Changing your eating and drinking habits, such as: ? Eating more foods that contain vitamin B12. ? Drinking less alcohol or no alcohol.  Getting vitamin B12 injections.  Taking vitamin B12 supplements. Your health care provider will tell you which dosage is best for you. Follow these instructions at home: Eating and drinking   Eat lots of healthy foods that contain vitamin B12, including: ? Meats and poultry. This includes beef, pork, chicken, [place], and organ meats, such as liver. ?  Seafood. This includes clams, rainbow trout, salmon, tuna, and haddock. ? Eggs. ? Cereal and dairy products that are fortified. This means that vitamin B12 has been added to the food. Check the label on the package to see if the  food is fortified. The items listed above may not be a complete list of recommended foods and beverages. Contact a dietitian for more information. General instructions  Get any injections that are prescribed by your health care provider.  Take supplements only as told by your health care provider. Follow the directions carefully.  Do not drink alcohol if your health care provider tells you not to. In some cases, you may only be asked to limit alcohol use.  Keep all follow-up visits as told by your health care provider. This is important. Contact a health care provider if:  Your symptoms come back. Get help right away if you:  Develop shortness of breath.  Have a rapid heart rate.  Have chest pain.  Become dizzy or lose consciousness. Summary  Vitamin B12 deficiency occurs when the body does not have enough vitamin B12.  The main causes of vitamin B12 deficiency include dietary deficiency, digestive diseases, pernicious anemia, and having a surgery in which part of the stomach or small intestine is removed.  In some cases, there are no symptoms of this condition. If the condition leads to anemia or nerve damage, various symptoms can occur, such as weakness, shortness of breath, and numbness.  Treatment may include getting vitamin B12 injections or taking vitamin B12 supplements. Eat lots of healthy foods that contain vitamin B12. This information is not intended to replace advice given to you by your health care provider. Make sure you discuss any questions you have with your health care provider. Document Revised: 11/28/2018 Document Reviewed: 02/18/2018 Elsevier Patient Education  2020 [employer].

## 2019-07-31 NOTE — Progress Notes (Signed)
Patient ID: Lauren Jones, female    DOB: Jun 25, 1966  Age: 54 y.o. MRN: [id number]    Subjective:  Subjective  HPI Lauren Jones presents for b12 check  And labs   She also c/o heavy periods that are irregular and clots   Review of Systems  Constitutional: Negative for appetite change, diaphoresis, fatigue and unexpected weight change.  Eyes: Negative for pain, redness and visual disturbance.  Respiratory: Negative for cough, chest tightness, shortness of breath and wheezing.   Cardiovascular: Negative for chest pain, palpitations and leg swelling.  Endocrine: Negative for cold intolerance, heat intolerance, polydipsia, polyphagia and polyuria.  Genitourinary: Negative for difficulty urinating, dysuria and frequency.  Neurological: Negative for dizziness, light-headedness, numbness and headaches.    History Past Medical History:  Diagnosis Date  . Hypothyroidism     She has a past surgical history that includes Thyroidectomy (Bilateral, 2014-2015?).   Her family history includes Colon cancer in her paternal grandmother; Hypertension in her father and mother; Sarcoidosis in her father.She reports that she has quit smoking. Her smoking use included cigarettes. She has never used smokeless tobacco. She reports current alcohol use. She reports that she does not use drugs.  Current Outpatient Medications on File Prior to Visit  Medication Sig Dispense Refill  . albuterol (PROVENTIL HFA;VENTOLIN HFA) 108 (90 Base) MCG/ACT inhaler Inhale 2 puffs into the lungs every 6 (six) hours as needed for wheezing or shortness of breath. 1 Inhaler 2  . Cholecalciferol (VITAMIN D3) 1000 units CAPS Take 1 capsule (1,000 Units total) by mouth daily. 90 capsule 1  . cyclobenzaprine (FLEXERIL) 10 MG tablet Take 1 tablet (10 mg total) by mouth daily. 30 tablet 1  . fluticasone (FLONASE) 50 MCG/ACT nasal spray Place 2 sprays into both nostrils daily. 16 g 6  . hydrochlorothiazide (HYDRODIURIL) 25 MG tablet Take 1  tablet (25 mg total) by mouth daily. 90 tablet 3  . levofloxacin (LEVAQUIN) 500 MG tablet Take 1 tablet (500 mg total) by mouth daily. 7 tablet 0  . levothyroxine (SYNTHROID) 200 MCG tablet Take 1 tablet (200 mcg total) by mouth daily before breakfast. 30 tablet 2  . ORACEA 40 MG capsule Take 40 mg by mouth daily.    . phentermine (ADIPEX-P) 37.5 MG tablet Take by mouth.     No current facility-administered medications on file prior to visit.     Objective:  Objective  Physical Exam Vitals and nursing note reviewed.  Constitutional:      Appearance: She is well-developed.  HENT:     Head: Normocephalic and atraumatic.  Eyes:     Conjunctiva/sclera: Conjunctivae normal.  Neck:     Thyroid: No thyromegaly.     Vascular: No carotid bruit or JVD.  Cardiovascular:     Rate and Rhythm: Normal rate and regular rhythm.     Heart sounds: Normal heart sounds. No murmur.  Pulmonary:     Effort: Pulmonary effort is normal. No respiratory distress.     Breath sounds: Normal breath sounds. No wheezing or rales.  Chest:     Chest wall: No tenderness.  Musculoskeletal:     Cervical back: Normal range of motion and neck supple.  Neurological:     Mental Status: She is alert and oriented to person, place, and time.    BP (!) 122/56 (BP Location: Right Arm, Patient Position: Sitting, Cuff Size: Normal)   Pulse (!) 57   Temp 97.6 F (36.4 C) (Temporal)   Resp 18   Ht 5'  6" (1.676 m)   Wt 231 lb 6.4 oz (105 kg)   SpO2 97%   BMI 37.35 kg/m  Wt Readings from Last 3 Encounters:  07/31/19 231 lb 6.4 oz (105 kg)  02/24/19 234 lb 3.2 oz (106.2 kg)  12/22/18 238 lb (108 kg)     Lab Results  Component Value Date   WBC 7.7 02/24/2019   HGB 12.2 02/24/2019   HCT 37.0 02/24/2019   PLT 317.0 02/24/2019   GLUCOSE 78 12/22/2018   CHOL 188 12/22/2018   TRIG 137.0 12/22/2018   HDL 50.50 12/22/2018   LDLCALC 110 (H) 12/22/2018   ALT 17 12/22/2018   AST 17 12/22/2018   NA 137 12/22/2018    K 3.6 12/22/2018   CL 99 12/22/2018   CREATININE 0.96 12/22/2018   BUN 12 12/22/2018   CO2 29 12/22/2018   TSH 0.25 (L) 02/24/2019   HGBA1C 5.8 12/22/2018    No results found.   Assessment & Plan:  Plan  I am having Lauren Jones maintain her cyclobenzaprine, Vitamin D3, hydrochlorothiazide, albuterol, levofloxacin, fluticasone, levothyroxine, Oracea, phentermine, and benzonatate. We administered cyanocobalamin. We will continue to administer cyanocobalamin.  Meds ordered this encounter  Medications  . benzonatate (TESSALON) 100 MG capsule    Sig: Take 1 capsule (100 mg total) by mouth 3 (three) times daily as needed for cough.    Dispense:  30 capsule    Refill:  0  . cyanocobalamin ((VITAMIN B-12)) injection 1,000 mcg    Problem List Items Addressed This Visit      Unprioritized   B12 deficiency    Check labs  b12 injection today      Relevant Medications   cyanocobalamin ((VITAMIN B-12)) injection 1,000 mcg   Other Relevant Orders   CBC with Differential/Platelet   Vitamin B12   Fatigue   Viral upper respiratory tract infection   Relevant Medications   benzonatate (TESSALON) 100 MG capsule    Other Visit Diagnoses    Menorrhagia with irregular cycle    -  Primary   Relevant Orders   Ambulatory referral to Obstetrics / Gynecology   CBC with Differential/Platelet   Vitamin B12   Comprehensive metabolic panel   PTT   Protime-INR ( [facility] ONLY)   Bruising       Relevant Orders   CBC with Differential/Platelet   PTT   Protime-INR ( [facility] ONLY)   Morbid obesity (HCC)       Relevant Medications   phentermine (ADIPEX-P) 37.5 MG tablet   Other Relevant Orders   Amb Ref to Medical Weight Management      Follow-up: Return in about 3 months (around 10/28/2019), or if symptoms worsen or fail to improve, for +.  Donato Schultz, DO

## 2019-08-01 NOTE — Assessment & Plan Note (Signed)
Check labs  b12 injection today

## 2019-08-03 DIAGNOSIS — L718 Other rosacea: Secondary | ICD-10-CM | POA: Diagnosis not present

## 2019-08-05 ENCOUNTER — Other Ambulatory Visit: Payer: BC Managed Care – PPO

## 2019-09-30 ENCOUNTER — Ambulatory Visit (INDEPENDENT_AMBULATORY_CARE_PROVIDER_SITE_OTHER): Payer: BC Managed Care – PPO | Admitting: Obstetrics & Gynecology

## 2019-09-30 ENCOUNTER — Other Ambulatory Visit (HOSPITAL_COMMUNITY)
Admission: RE | Admit: 2019-09-30 | Discharge: 2019-09-30 | Disposition: A | Discharge status: Home / Self Care | Payer: BC Managed Care – PPO | Source: Ambulatory Visit | Attending: Obstetrics & Gynecology | Admitting: Obstetrics & Gynecology

## 2019-09-30 ENCOUNTER — Encounter: Payer: Self-pay | Admitting: Obstetrics & Gynecology

## 2019-09-30 ENCOUNTER — Other Ambulatory Visit: Payer: Self-pay

## 2019-09-30 VITALS — BP 131/77 | HR 54 | Ht 66.0 in | Wt 237.1 lb

## 2019-09-30 DIAGNOSIS — Z01419 Encounter for gynecological examination (general) (routine) without abnormal findings: Secondary | ICD-10-CM

## 2019-09-30 DIAGNOSIS — N939 Abnormal uterine and vaginal bleeding, unspecified: Secondary | ICD-10-CM | POA: Insufficient documentation

## 2019-09-30 DIAGNOSIS — Z1231 Encounter for screening mammogram for malignant neoplasm of breast: Secondary | ICD-10-CM

## 2019-09-30 NOTE — Progress Notes (Signed)
Subjective:     Lauren Jones is a 54 y.o. female here for a routine exam. [id number]. Pt presents for eval of irreg cycles. She reports that for the past 2 years since age 41, she has had cycles that range from every 2-6 months. Her last cycle was very heavy and lasted 2 weeks and that concerned her. She does not have bleeding between cycles. She denies hot flushes or mood changes. She has had no weight loss or other constitutional sx. She denies abd pain.   Pt has never had a colonoscopy. She is a Consulting civil engineer at [employer].    Gynecologic History Patient's last menstrual period was 09/25/2019. Contraception: abstinence Last Pap: >5 years prev Results were: normal Last mammogram: >3 years prev Results were: normal  Obstetric History OB History  Gravida Para Term Preterm AB Living  4 1 1   3 1   SAB TAB Ectopic Multiple Live Births  1 2     1     # Outcome Date GA Lbr Len/2nd Weight Sex Delivery Anes PTL Lv  4 Term 1988 [redacted]w[redacted]d   M Vag-Spont None N LIV  3 TAB           2 TAB           1 SAB            The following portions of the patient's history were reviewed and updated as appropriate: allergies, current medications, past family history, past medical history, past social history, past surgical history and problem list.  Review of Systems Pertinent items are noted in HPI.    Objective:  BP 131/77   Pulse (!) 54   Ht 5\' 6"  (1.676 m)   Wt 237 lb 1.9 oz (107.6 kg)   LMP 09/25/2019   BMI 38.27 kg/m   General Appearance:    Alert, cooperative, no distress, appears stated age  Head:    Normocephalic, without obvious abnormality, atraumatic  Eyes:    conjunctiva/corneas clear, EOM's intact, both eyes  Ears:    Normal external ear canals, both ears  Nose:   Nares normal, septum midline, mucosa normal, no drainage    or sinus tenderness  Throat:   Lips, mucosa, and tongue normal; teeth and gums normal  Neck:   Supple, symmetrical, trachea midline, no adenopathy;    thyroid:  no  enlargement/tenderness/nodules  Back:     Symmetric, no curvature, ROM normal, no CVA tenderness  Lungs:     respirations unlabored  Chest Wall:    No tenderness or deformity   Heart:    Regular rate and rhythm  Breast Exam:    No tenderness, masses, or nipple abnormality  Abdomen:     Soft, non-tender, bowel sounds active all four quadrants,    no masses, no organomegaly  Genitalia:    Normal female without lesion, discharge or tenderness   Cervic without lesions; the uterus is small and mobile. There are not adnexal masses   Extremities:   Extremities normal, atraumatic, no cyanosis or edema  Pulses:   2+ and symmetric all extremities  Skin:   Skin color, texture, turgor normal, no rashes or lesions    Assessment:    Healthy female exam.   AUB- suspect perimenopause.    Plan:  F/u PAP and cx Screening mammogram Referral to GI for colonoscopy Pelvic [redacted]w[redacted]d If endometrial stripe thickened will rec Endo bx F/u in 6 months or sooner prn      Shamal Stracener L. Harraway-Smith,  M.D., Lauren Jones

## 2019-09-30 NOTE — Patient Instructions (Signed)

## 2019-10-01 ENCOUNTER — Ambulatory Visit (HOSPITAL_BASED_OUTPATIENT_CLINIC_OR_DEPARTMENT_OTHER)
Admission: RE | Admit: 2019-10-01 | Discharge: 2019-10-01 | Disposition: A | Discharge status: Home / Self Care | Payer: BC Managed Care – PPO | Source: Ambulatory Visit | Attending: Obstetrics & Gynecology | Admitting: Obstetrics & Gynecology

## 2019-10-01 DIAGNOSIS — Z01419 Encounter for gynecological examination (general) (routine) without abnormal findings: Secondary | ICD-10-CM

## 2019-10-01 DIAGNOSIS — N939 Abnormal uterine and vaginal bleeding, unspecified: Secondary | ICD-10-CM | POA: Diagnosis not present

## 2019-10-01 DIAGNOSIS — Z1231 Encounter for screening mammogram for malignant neoplasm of breast: Secondary | ICD-10-CM | POA: Diagnosis not present

## 2019-10-02 ENCOUNTER — Other Ambulatory Visit: Payer: Self-pay | Admitting: Obstetrics & Gynecology

## 2019-10-02 DIAGNOSIS — R928 Other abnormal and inconclusive findings on diagnostic imaging of breast: Secondary | ICD-10-CM

## 2019-10-02 LAB — CYTOLOGY - PAP
Comment: NEGATIVE
Diagnosis: NEGATIVE
High risk HPV: NEGATIVE

## 2019-10-07 ENCOUNTER — Telehealth: Payer: Self-pay

## 2019-10-07 DIAGNOSIS — R600 Localized edema: Secondary | ICD-10-CM

## 2019-10-07 DIAGNOSIS — I10 Essential (primary) hypertension: Secondary | ICD-10-CM

## 2019-10-07 NOTE — Telephone Encounter (Signed)
Patient called in to see if Dr. Laury Axon could send in a prescription for  hydrochlorothiazide (HYDRODIURIL) 25 MG tablet [[id number]]    Please send it to [id number] Ginette Otto, [place] - [address] AT [facility] OF [facility] & [address]  [address] [place], [place] [address]  Phone:  [phone] Fax:  [phone]  DEA #:  [id number]

## 2019-10-07 NOTE — Telephone Encounter (Signed)
Ok to send #90. 

## 2019-10-07 NOTE — Telephone Encounter (Signed)
Okay to refill? Last refill was in 2019. Pt seen on 07/31/19.

## 2019-10-08 MED ORDER — HYDROCHLOROTHIAZIDE 25 MG PO TABS: 25.0000 mg | ORAL_TABLET | Freq: Every day | ORAL | 0 refills | Status: DC

## 2019-10-08 NOTE — Telephone Encounter (Signed)
Refill sent.

## 2019-10-20 ENCOUNTER — Ambulatory Visit
Admission: RE | Admit: 2019-10-20 | Discharge: 2019-10-20 | Disposition: A | Discharge status: Home / Self Care | Payer: BC Managed Care – PPO | Source: Ambulatory Visit | Attending: Obstetrics & Gynecology | Admitting: Obstetrics & Gynecology

## 2019-10-20 ENCOUNTER — Other Ambulatory Visit: Payer: Self-pay

## 2019-10-20 DIAGNOSIS — R928 Other abnormal and inconclusive findings on diagnostic imaging of breast: Secondary | ICD-10-CM

## 2019-10-20 DIAGNOSIS — N6001 Solitary cyst of right breast: Secondary | ICD-10-CM | POA: Diagnosis not present

## 2019-10-30 ENCOUNTER — Encounter: Payer: BC Managed Care – PPO | Admitting: Family Medicine

## 2019-11-11 ENCOUNTER — Encounter: Payer: Self-pay | Admitting: Obstetrics & Gynecology

## 2019-11-11 ENCOUNTER — Other Ambulatory Visit: Payer: Self-pay

## 2019-11-11 ENCOUNTER — Ambulatory Visit (INDEPENDENT_AMBULATORY_CARE_PROVIDER_SITE_OTHER): Payer: BC Managed Care – PPO | Admitting: Obstetrics & Gynecology

## 2019-11-11 VITALS — BP 107/67 | HR 63 | Ht 66.0 in | Wt 235.0 lb

## 2019-11-11 DIAGNOSIS — F411 Generalized anxiety disorder: Secondary | ICD-10-CM | POA: Diagnosis not present

## 2019-11-11 DIAGNOSIS — N939 Abnormal uterine and vaginal bleeding, unspecified: Secondary | ICD-10-CM | POA: Diagnosis not present

## 2019-11-11 MED ORDER — MEGESTROL ACETATE 40 MG PO TABS: ORAL_TABLET | ORAL | 5 refills | Status: DC

## 2019-11-11 MED ORDER — DIAZEPAM 10 MG PO TABS: 10.0000 mg | ORAL_TABLET | Freq: Once | ORAL | 0 refills | Status: AC

## 2019-11-11 MED ORDER — MISOPROSTOL 200 MCG PO TABS: ORAL_TABLET | ORAL | 2 refills | Status: DC

## 2019-11-11 NOTE — Progress Notes (Signed)
History:  54 y.o. [id number] here today for f/u of AUB. Pt continues to have very heavy cycles. She has gone through 1 box of 20 pads in the last 2 Jodoin. She denies dizziness or SOB. She is here for management and an end bx. Pt reports anxiety over the end bx but, knows that it has to be done.    Review of Systems:  Pertinent items are noted in HPI.    Objective:  Physical Exam Blood pressure 107/67, pulse 63, height 5\' 6"  (1.676 m), weight 235 lb (106.6 kg), last menstrual period 11/08/2019.  CONSTITUTIONAL: Well-developed, well-nourished female in no acute distress.  HENT:  Normocephalic, atraumatic EYES: Conjunctivae and EOM are normal. No scleral icterus.  NECK: Normal range of motion SKIN: Skin is warm and dry. No rash noted. Not diaphoretic.No pallor. [place]: Alert and oriented to person, place, and time. Normal coordination.  Abd: Soft, nontender and nondistended Pelvic: Normal appearing external genitalia; normal appearing vaginal mucosa and cervix. Large amount of blood and clots in vaginal vault.   The indications for endometrial biopsy were reviewed.   Risks of the biopsy including cramping, bleeding, infection, uterine perforation, inadequate specimen and need for additional procedures  were discussed. The patient states she understands and agrees to undergo procedure today. Consent was signed. Time out was performed. Urine HCG was negative. A sterile speculum was placed in the patient's vagina and the cervix was prepped with Betadine. A single-toothed tenaculum was placed on the anterior lip of the cervix to stabilize it. I wan unable to complete any portion of the biopsy due to pts anxiety over the procedure and discomfort with all portions of the exam The procedure was aborted after multiple attempts to proceed.      Labs and Imaging US BREAST LTD UNI RIGHT INC AXILLA  Result Date: 10/20/2019 CLINICAL DATA:  Recall from screening mammography with tomosynthesis, possible  asymmetry involving the OUTER RIGHT breast at MIDDLE to POSTERIOR depth visible only on the CC images. EXAM: DIGITAL DIAGNOSTIC RIGHT MAMMOGRAM WITH CAD AND TOMO ULTRASOUND RIGHT BREAST COMPARISON:  Previous exam(s). ACR Breast Density Category b: There are scattered areas of fibroglandular density. FINDINGS: Tomosynthesis and synthesized spot-compression CC view of the area of concern in the RIGHT breast and a tomosynthesis and synthesized full field mediolateral view of the RIGHT breast were obtained. The spot compression images confirm a faint isodense mass in the OUTER breast at MIDDLE to POSTERIOR depth measuring approximately 9 mm without associated architectural distortion or suspicious calcifications. The mass is essentially in the retroareolar location on the full field mediolateral images, indicating its location is likely at or near 9 o'clock. The full field mediolateral image was processed with CAD. Targeted RIGHT breast ultrasound is performed, showing an oval parallel circumscribed anechoic mass with a thin internal septation at the 9 o'clock position approximately 6 cm from the nipple at MIDDLE to POSTERIOR depth measuring approximately 8 x 3 x 5 mm, demonstrating posterior acoustic enhancement and no internal power Doppler flow, corresponding to the screening mammographic finding. No suspicious solid mass or abnormal acoustic shadowing is identified. IMPRESSION: 1. No mammographic or sonographic evidence of malignancy involving the RIGHT breast. 2. Benign mildly complicated cyst in the OUTER RIGHT breast at MIDDLE to POSTERIOR depth which accounts for the screening mammographic finding. RECOMMENDATION: Screening mammogram in one year.(Code:SM-B-01Y) I have discussed the findings and recommendations with the patient. If applicable, a reminder letter will be sent to the patient regarding the next appointment. BI-RADS CATEGORY  2: Benign. Electronically Signed   By: Hulan Saas M.D.   On:  10/20/2019 15:02   MM DIAG BREAST TOMO UNI RIGHT  Result Date: 10/20/2019 CLINICAL DATA:  Recall from screening mammography with tomosynthesis, possible asymmetry involving the OUTER RIGHT breast at MIDDLE to POSTERIOR depth visible only on the CC images. EXAM: DIGITAL DIAGNOSTIC RIGHT MAMMOGRAM WITH CAD AND TOMO ULTRASOUND RIGHT BREAST COMPARISON:  Previous exam(s). ACR Breast Density Category b: There are scattered areas of fibroglandular density. FINDINGS: Tomosynthesis and synthesized spot-compression CC view of the area of concern in the RIGHT breast and a tomosynthesis and synthesized full field mediolateral view of the RIGHT breast were obtained. The spot compression images confirm a faint isodense mass in the OUTER breast at MIDDLE to POSTERIOR depth measuring approximately 9 mm without associated architectural distortion or suspicious calcifications. The mass is essentially in the retroareolar location on the full field mediolateral images, indicating its location is likely at or near 9 o'clock. The full field mediolateral image was processed with CAD. Targeted RIGHT breast ultrasound is performed, showing an oval parallel circumscribed anechoic mass with a thin internal septation at the 9 o'clock position approximately 6 cm from the nipple at MIDDLE to POSTERIOR depth measuring approximately 8 x 3 x 5 mm, demonstrating posterior acoustic enhancement and no internal power Doppler flow, corresponding to the screening mammographic finding. No suspicious solid mass or abnormal acoustic shadowing is identified. IMPRESSION: 1. No mammographic or sonographic evidence of malignancy involving the RIGHT breast. 2. Benign mildly complicated cyst in the OUTER RIGHT breast at MIDDLE to POSTERIOR depth which accounts for the screening mammographic finding. RECOMMENDATION: Screening mammogram in one year.(Code:SM-B-01Y) I have discussed the findings and recommendations with the patient. If applicable, a reminder  letter will be sent to the patient regarding the next appointment. BI-RADS CATEGORY  2: Benign. Electronically Signed   By: Hulan Saas M.D.   On: 10/20/2019 15:02    Assessment & Plan:  AUB in a 53 yo who continues to have monthly cycles that have become increasingly heavier. The endo stripe is 56mm and a biopsy is indicated. A endo biopsy was attempted without success today due to pts anxiety and fear. Attempts to calm pt were ineffective.  The pt does not want to go to the OR and wants to reattempts office procedure. I have suggested Valium in that case prior to the procedure attempt and cytotec. She agrees and declined a D&C with hysteroscopy in the OR.   Pt will f/u for endo bx after Valium and cytotec. She was informed someone would need to bring her to her appt and take her home.   Diagnoses and all orders for this visit:  Abnormal uterine bleeding (AUB) -     diazepam (VALIUM) 10 MG tablet; Take 1 tablet (10 mg total) by mouth once for 1 dose. -     misoprostol (CYTOTEC) 200 MCG tablet; Place two tablets in the vagina the night prior to your next clinic appointment -     megestrol (MEGACE) 40 MG tablet; Take 2 tablets bid for 5 [place] followed by 1 table twice a day.  Anxiety reaction -     diazepam (VALIUM) 10 MG tablet; Take 1 tablet (10 mg total) by mouth once for 1 dose.  Medea Deines L. Harraway-Smith, M.D., Evern Core

## 2019-11-11 NOTE — Patient Instructions (Signed)
Endometrial Biopsy  Endometrial biopsy is a procedure in which a tissue sample is taken from inside the uterus. The sample is taken from the endometrium, which is the lining of the uterus. The tissue sample is then checked under a microscope to see if the tissue is normal or abnormal. This procedure helps to determine where you are in your menstrual cycle and how hormone levels are affecting the lining of the uterus. This procedure may also be used to evaluate uterine bleeding or to diagnose endometrial cancer, endometrial tuberculosis, polyps, or other inflammatory conditions. Tell a health care provider about:  Any allergies you have.  All medicines you are taking, including vitamins, herbs, eye drops, creams, and over-the-counter medicines.  Any problems you or family members have had with anesthetic medicines.  Any blood disorders you have.  Any surgeries you have had.  Any medical conditions you have.  Whether you are pregnant or may be pregnant. What are the risks? Generally, this is a safe procedure. However, problems may occur, including:  Bleeding.  Pelvic infection.  Puncture of the wall of the uterus with the biopsy device (rare). What happens before the procedure?  Keep a record of your menstrual cycles as told by your health care provider. You may need to schedule your procedure for a specific time in your cycle.  You may want to bring a sanitary pad to wear after the procedure.  Ask your health care provider about: ? Changing or stopping your regular medicines. This is especially important if you are taking diabetes medicines or blood thinners. ? Taking medicines such as aspirin and ibuprofen. These medicines can thin your blood. Do not take these medicines before your procedure if your health care provider instructs you not to.  Plan to have someone take you home from the hospital or clinic. What happens during the procedure?  To lower your risk of  infection: ? Your health care team will wash or sanitize their hands.  You will lie on an exam table with your feet and legs supported as in a pelvic exam.  Your health care provider will insert an instrument (speculum) into your vagina to see your cervix.  Your cervix will be cleansed with an antiseptic solution.  A medicine (local anesthetic) will be used to numb the cervix.  A forceps instrument (tenaculum) will be used to hold your cervix steady for the biopsy.  A thin, rod-like instrument (uterine sound) will be inserted through your cervix to determine the length of your uterus and the location where the biopsy sample will be removed.  A thin, flexible tube (catheter) will be inserted through your cervix and into the uterus. The catheter will be used to collect the biopsy sample from your endometrial tissue.  The catheter and speculum will then be removed, and the tissue sample will be sent to a lab for examination. What happens after the procedure?  You will rest in a recovery area until you are ready to go home.  You may have mild cramping and a small amount of vaginal bleeding. This is normal.  It is up to you to get the results of your procedure. Ask your health care provider, or the department that is doing the procedure, when your results will be ready. Summary  Endometrial biopsy is a procedure in which a tissue sample is taken from the endometrium, which is the lining of the uterus.  This procedure may help to diagnose menstrual cycle problems, abnormal bleeding, or other conditions affecting   the endometrium.  Before the procedure, keep a record of your menstrual cycles as told by your health care provider.  The tissue sample that is removed will be checked under a microscope to see if it is normal or abnormal. This information is not intended to replace advice given to you by your health care provider. Make sure you discuss any questions you have with your health care  provider. Document Revised: 05/24/2017 Document Reviewed: 06/27/2016 Elsevier Patient Education  2020 Elsevier Inc. Endometrial Biopsy, Care After This sheet gives you information about how to care for yourself after your procedure. Your health care provider may also give you more specific instructions. If you have problems or questions, contact your health care provider. What can I expect after the procedure? After the procedure, it is common to have:  Mild cramping.  A small amount of vaginal bleeding for a few [place]. This is normal. Follow these instructions at home:   Take over-the-counter and prescription medicines only as told by your health care provider.  Do not douche, use tampons, or have sexual intercourse until your health care provider approves.  Return to your normal activities as told by your health care provider. Ask your health care provider what activities are safe for you.  Follow instructions from your health care provider about any activity restrictions, such as restrictions on strenuous exercise or heavy lifting. Contact a health care provider if:  You have heavy bleeding, or bleed for longer than 2 [place] after the procedure.  You have bad smelling discharge from your vagina.  You have a fever or chills.  You have a burning sensation when urinating or you have difficulty urinating.  You have severe pain in your lower abdomen. Get help right away if:  You have severe cramps in your stomach or back.  You pass large blood clots.  Your bleeding increases.  You become weak or light-headed, or you pass out. Summary  After the procedure, it is common to have mild cramping and a small amount of vaginal bleeding for a few [place].  Do not douche, use tampons, or have sexual intercourse until your health care provider approves.  Return to your normal activities as told by your health care provider. Ask your health care provider what activities are safe for  you. This information is not intended to replace advice given to you by your health care provider. Make sure you discuss any questions you have with your health care provider. Document Revised: 05/24/2017 Document Reviewed: 06/27/2016 Elsevier Patient Education  2020 Elsevier Inc.  

## 2019-11-30 ENCOUNTER — Encounter: Payer: Self-pay | Admitting: Obstetrics & Gynecology

## 2019-11-30 ENCOUNTER — Telehealth: Payer: Self-pay

## 2019-11-30 NOTE — Telephone Encounter (Signed)
Patient called and made aware that Lauren Jones has been trying to reach her in regards to scheduling a colonoscopy for her. Patient given their number to their office to call back and schedule her colonoscopy. Armandina Stammer RN

## 2019-11-30 NOTE — Telephone Encounter (Signed)
-----   Message from Willodean Rosenthal, MD sent at 11/30/2019  9:04 AM EDT ----- Please call pt re the colonoscopy (see attached). Please document.  Thanks,  Clh-S     ----- Message ----- From: Teena Dunk Sent: 11/30/2019   8:43 AM EDT To: Willodean Rosenthal, MD

## 2019-12-23 ENCOUNTER — Ambulatory Visit (INDEPENDENT_AMBULATORY_CARE_PROVIDER_SITE_OTHER): Payer: BC Managed Care – PPO | Admitting: Obstetrics & Gynecology

## 2019-12-23 ENCOUNTER — Encounter: Payer: Self-pay | Admitting: Obstetrics & Gynecology

## 2019-12-23 ENCOUNTER — Other Ambulatory Visit (HOSPITAL_COMMUNITY)
Admission: RE | Admit: 2019-12-23 | Discharge: 2019-12-23 | Disposition: A | Discharge status: Home / Self Care | Payer: BC Managed Care – PPO | Source: Ambulatory Visit | Attending: Obstetrics & Gynecology | Admitting: Obstetrics & Gynecology

## 2019-12-23 ENCOUNTER — Other Ambulatory Visit: Payer: Self-pay

## 2019-12-23 VITALS — BP 111/65 | HR 65 | Wt 236.0 lb

## 2019-12-23 DIAGNOSIS — N924 Excessive bleeding in the premenopausal period: Secondary | ICD-10-CM | POA: Diagnosis not present

## 2019-12-23 NOTE — Patient Instructions (Signed)
Endometrial Biopsy, Care After This sheet gives you information about how to care for yourself after your procedure. Your health care provider may also give you more specific instructions. If you have problems or questions, contact your health care provider. What can I expect after the procedure? After the procedure, it is common to have:  Mild cramping.  A small amount of vaginal bleeding for a few [place]. This is normal. Follow these instructions at home:   Take over-the-counter and prescription medicines only as told by your health care provider.  Do not douche, use tampons, or have sexual intercourse until your health care provider approves.  Return to your normal activities as told by your health care provider. Ask your health care provider what activities are safe for you.  Follow instructions from your health care provider about any activity restrictions, such as restrictions on strenuous exercise or heavy lifting. Contact a health care provider if:  You have heavy bleeding, or bleed for longer than 2 [place] after the procedure.  You have bad smelling discharge from your vagina.  You have a fever or chills.  You have a burning sensation when urinating or you have difficulty urinating.  You have severe pain in your lower abdomen. Get help right away if:  You have severe cramps in your stomach or back.  You pass large blood clots.  Your bleeding increases.  You become weak or light-headed, or you pass out. Summary  After the procedure, it is common to have mild cramping and a small amount of vaginal bleeding for a few [place].  Do not douche, use tampons, or have sexual intercourse until your health care provider approves.  Return to your normal activities as told by your health care provider. Ask your health care provider what activities are safe for you. This information is not intended to replace advice given to you by your health care provider. Make sure you discuss any  questions you have with your health care provider. Document Revised: 05/24/2017 Document Reviewed: 06/27/2016 Elsevier Patient Education  2020 Elsevier Inc.  

## 2019-12-23 NOTE — Progress Notes (Signed)
Pt with h/o abnormal perimenopausal bleeding. She was seen in mid May but, was unable to tolerate the endo bx in the ofc and declined an EUA in the OR. She took Cytotec last night and Valium 1 1/2 hours prev. Pt was brought her by her mother who will drive her home. Pt has been on Megace since her last visit. Her bleeding has stopped. She is taking one tablet per day.       Indications for endometrial biopsy were reviewed.   Risks of the biopsy including cramping, bleeding, infection, uterine perforation, inadequate specimen and need for additional procedures  were discussed. The patient states she understands and agrees to undergo procedure today. Consent was signed. Time out was performed. Urine HCG was negative. A sterile speculum was placed in the patient's vagina and the cervix was prepped with Betadine. A single-toothed tenaculum was placed on the anterior lip of the cervix to stabilize it. The 3 mm pipelle was introduced into the endometrial cavity without difficulty to a depth of 9cm, and a modest amount of tissue was obtained and sent to pathology. The instruments were removed from the patient's vagina. Minimal bleeding from the cervix was noted. The patient tolerated the procedure well. Routine post-procedure instructions were given to the patient. The patient will follow up to review the results and for further management.    Rec keep Megace 40mg  po daily F/u in 6 weeks Pt instructed to call if bleeding increases.   Vanisha Whiten L. Harraway-Smith, M.D., 

## 2019-12-23 NOTE — Progress Notes (Signed)
Patient presents for EMB. Patient states she used the cytotec last night. Patient took the Valium 1 hour ago.  Armandina Stammer RN

## 2019-12-25 LAB — SURGICAL PATHOLOGY

## 2020-01-06 ENCOUNTER — Other Ambulatory Visit: Payer: Self-pay | Admitting: Family Medicine

## 2020-01-06 DIAGNOSIS — I10 Essential (primary) hypertension: Secondary | ICD-10-CM

## 2020-01-06 DIAGNOSIS — R600 Localized edema: Secondary | ICD-10-CM

## 2020-02-03 ENCOUNTER — Ambulatory Visit (INDEPENDENT_AMBULATORY_CARE_PROVIDER_SITE_OTHER): Payer: BC Managed Care – PPO | Admitting: Obstetrics & Gynecology

## 2020-02-03 ENCOUNTER — Other Ambulatory Visit: Payer: Self-pay

## 2020-02-03 ENCOUNTER — Encounter: Payer: Self-pay | Admitting: Obstetrics & Gynecology

## 2020-02-03 VITALS — BP 121/79 | HR 22 | Temp 98.2°F | Ht 66.0 in | Wt 240.0 lb

## 2020-02-03 DIAGNOSIS — N939 Abnormal uterine and vaginal bleeding, unspecified: Secondary | ICD-10-CM

## 2020-02-03 MED ORDER — MEGESTROL ACETATE 40 MG PO TABS: ORAL_TABLET | ORAL | 5 refills | Status: DC

## 2020-02-03 NOTE — Progress Notes (Signed)
History:  54 y.o. [id number] here today for f/u of AUB. She is on Megace and reports that she is having monthly bleeding tha tis light. She is no longer passing clots. She uses about 2 pads per day with her cycles and denies intermenstrual bleeding.   The following portions of the patient's history were reviewed and updated as appropriate: allergies, current medications, past family history, past medical history, past social history, past surgical history and problem list.  Review of Systems:  Pertinent items are noted in HPI.    Objective:  Physical Exam Blood pressure 121/79, pulse (!) 22, temperature 98.2 F (36.8 C), height 5\' 6"  (1.676 m), weight 240 lb (108.9 kg), last menstrual period 01/24/2020.  CONSTITUTIONAL: Well-developed, well-nourished female in no acute distress.  HENT:  Normocephalic, atraumatic EYES: Conjunctivae and EOM are normal. No scleral icterus.  NECK: Normal range of motion SKIN: Skin is warm and dry. No rash noted. Not diaphoretic.No pallor. NEUROLGIC: Alert and oriented to person, place, and time. Normal coordination.  Pelvic: deferred  Labs and Imaging 12/23/2019 ENDOMETRIUM, BIOPSY:  - Benign endometrium with progestin effect  - No hyperplasia or malignancy identified   Assessment & Plan:  AUB- improved on Megace  Keep Megace 40mg  daily.   F/u in 6 months or sooner prn  Reviewed surg path from endo bx.   Total face-to-face time with patient was 20 min.  Greater than 50% was spent in counseling and coordination of care with the patient.   Penda Venturi L. Harraway-Smith, M.D., 12/25/2019

## 2020-02-03 NOTE — Progress Notes (Signed)
Patient presents for follow up of bleeding and EMB results. Patient has been on LMP for about one and half weeks. Armandina Stammer RN

## 2020-02-05 ENCOUNTER — Other Ambulatory Visit: Payer: Self-pay

## 2020-02-05 ENCOUNTER — Ambulatory Visit: Payer: BC Managed Care – PPO

## 2020-02-05 DIAGNOSIS — Z0110 Encounter for examination of ears and hearing without abnormal findings: Secondary | ICD-10-CM

## 2020-02-05 NOTE — Progress Notes (Signed)
Patient  Here today for Hearing test for form to be filled out. Patient passed hearing test. Form signed by provider.

## 2020-02-09 ENCOUNTER — Telehealth: Payer: Self-pay | Admitting: Family Medicine

## 2020-02-09 ENCOUNTER — Other Ambulatory Visit: Payer: BC Managed Care – PPO

## 2020-02-09 DIAGNOSIS — Z23 Encounter for immunization: Secondary | ICD-10-CM

## 2020-02-09 DIAGNOSIS — Z111 Encounter for screening for respiratory tuberculosis: Secondary | ICD-10-CM

## 2020-02-09 NOTE — Telephone Encounter (Signed)
Please advise 

## 2020-02-09 NOTE — Telephone Encounter (Signed)
Spoke w/ Pt- she is needing for school- orders placed for [facility].

## 2020-02-09 NOTE — Telephone Encounter (Signed)
Pt would like to get test for antibodies for hepatitis B. She would like to know if this test could be done. She also want Qu[place]ifer[facility]  Gold TB Test. Please advise.

## 2020-02-09 NOTE — Telephone Encounter (Signed)
Ok to order but we need dx -- why is she wanting them

## 2020-02-12 LAB — QUANTIFERON-TB GOLD PLUS
Mitogen-NIL: >10 IU/mL
NIL: 0.03 IU/mL
QuantiFERON-TB Gold Plus: NEGATIVE
TB1-NIL: 0 IU/mL
TB2-NIL: 0 IU/mL

## 2020-02-12 LAB — HEPATITIS B SURFACE ANTIBODY,QUALITATIVE: Hep B S Ab: NONREACTIVE

## 2020-02-18 ENCOUNTER — Other Ambulatory Visit: Payer: Self-pay

## 2020-02-18 ENCOUNTER — Ambulatory Visit (INDEPENDENT_AMBULATORY_CARE_PROVIDER_SITE_OTHER): Payer: BC Managed Care – PPO

## 2020-02-18 DIAGNOSIS — Z23 Encounter for immunization: Secondary | ICD-10-CM | POA: Diagnosis not present

## 2020-02-18 IMAGING — DX DG CHEST 2V
2 series · 2 of 2 positions shown · non-contrast
Comparison: None.

CLINICAL DATA: Shortness of breath and lower extremity edema

EXAM:
CHEST - 2 VIEW

[chest pa]
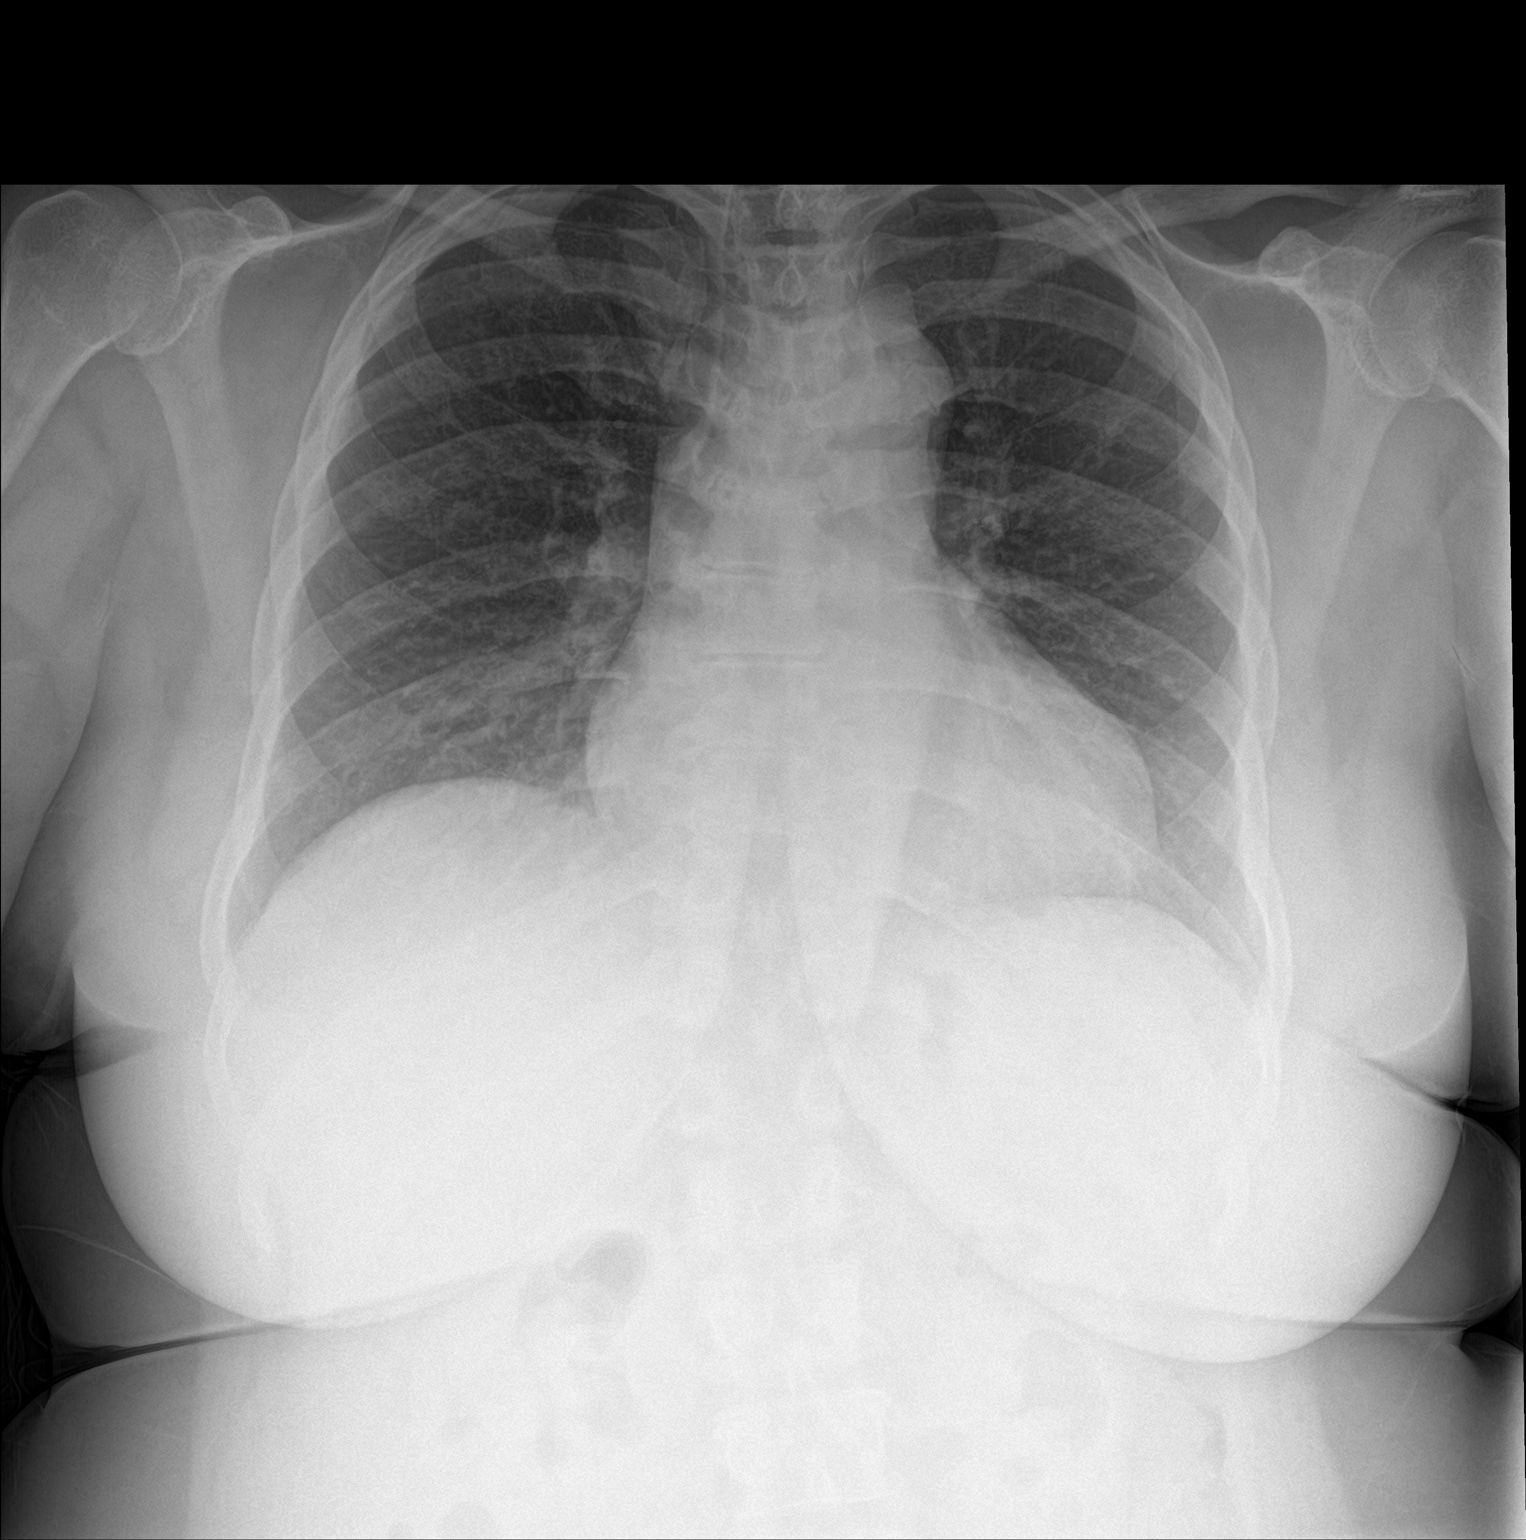

[chest lat]
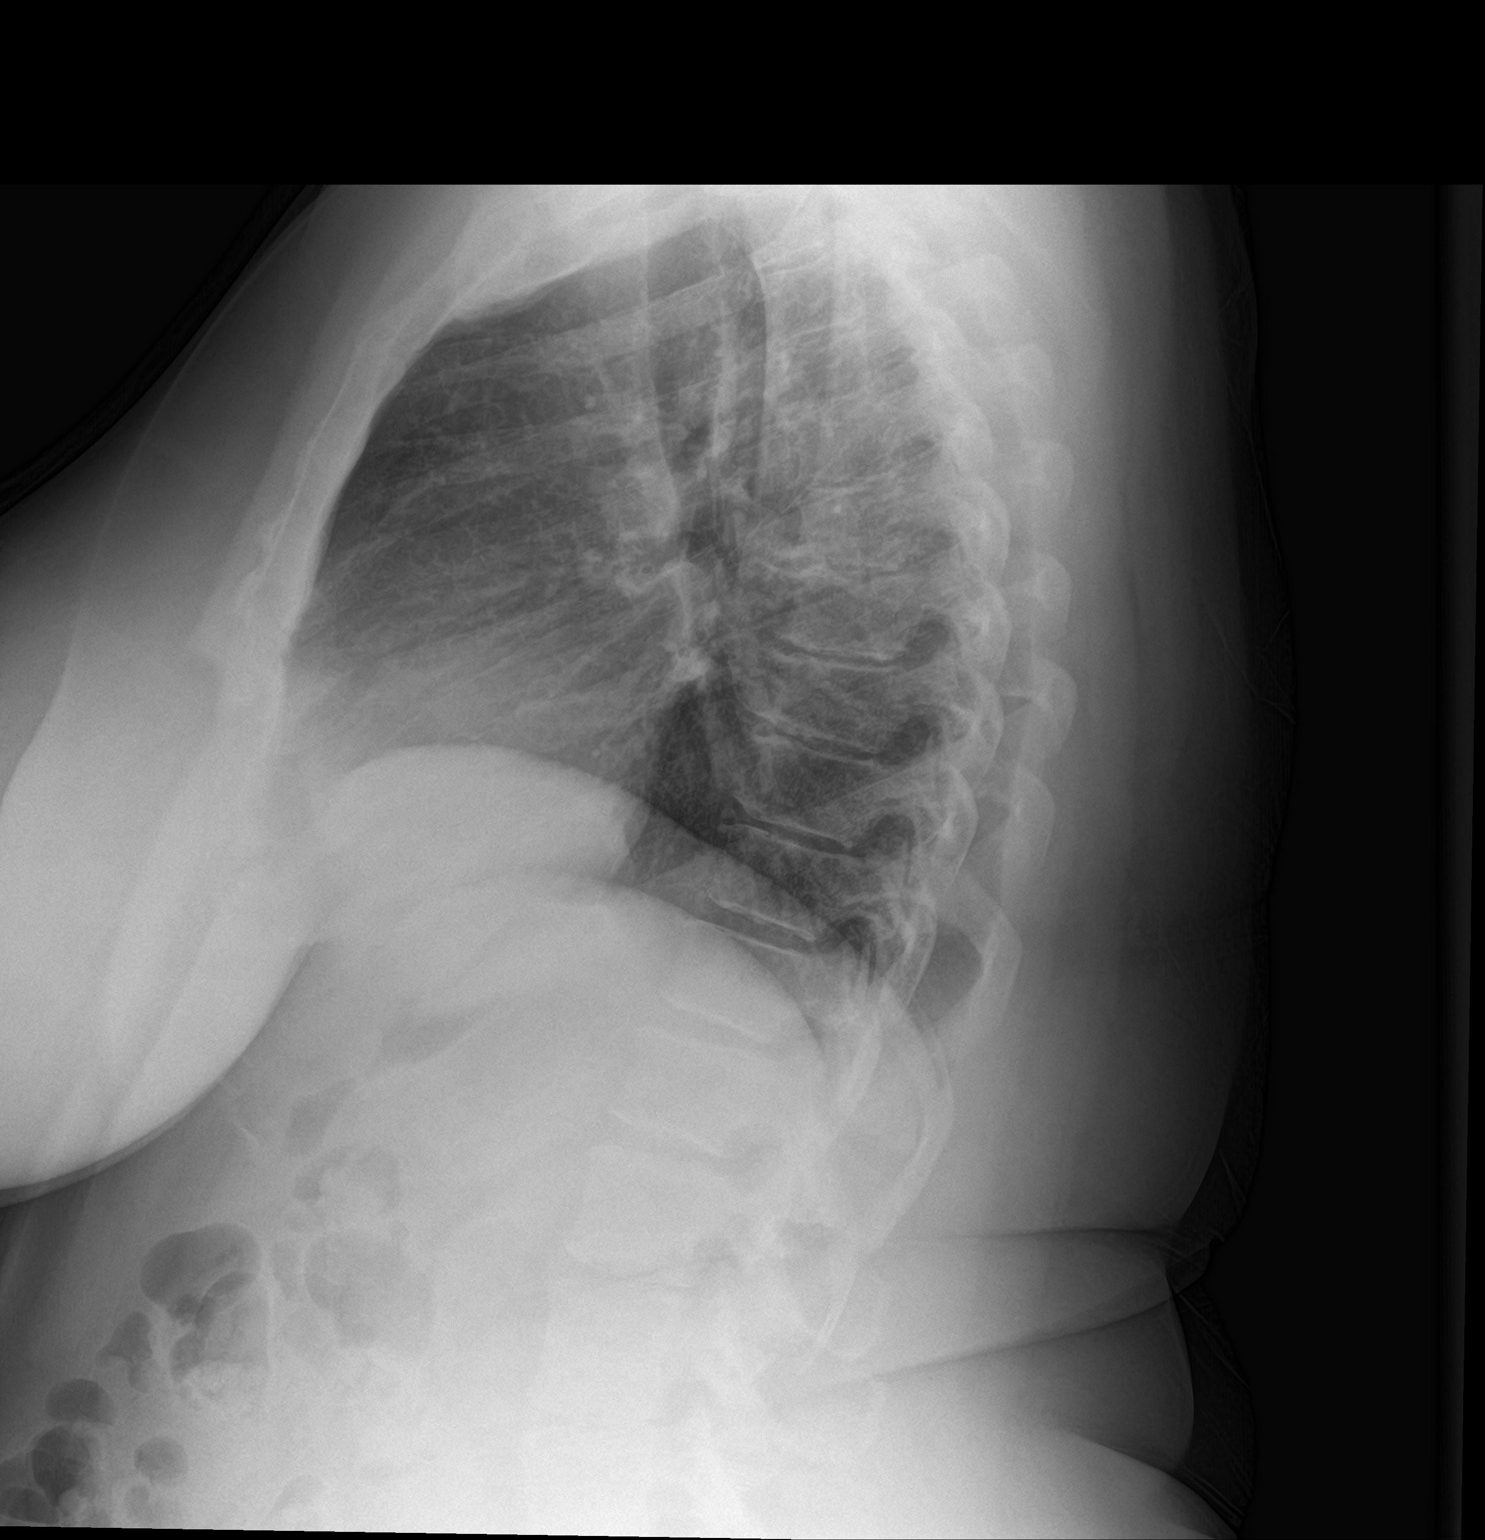

[2 of 2 positions shown; findings below may reference images not displayed]

FINDINGS: The heart size and mediastinal contours are within normal limits.
Both lungs are clear. The visualized skeletal structures are
unremarkable.
IMPRESSION: No active cardiopulmonary disease.

## 2020-02-18 NOTE — Progress Notes (Signed)
Patient here today for HEP-B vaccine per [facility] request.  Patient given Heplisav-B, right deltoid , IM , patient tolerated injection well.  Patient is scheduled for next month for second dosing of immunization.

## 2020-02-22 ENCOUNTER — Telehealth: Payer: Self-pay | Admitting: Family Medicine

## 2020-02-22 NOTE — Telephone Encounter (Signed)
Patient states she would like to speak with the nurse inference to TB letter written.  Please Advise

## 2020-02-23 ENCOUNTER — Telehealth: Payer: Self-pay | Admitting: Family Medicine

## 2020-02-23 NOTE — Telephone Encounter (Signed)
Patient is returning the call, please advise. CB is [phone]

## 2020-02-23 NOTE — Telephone Encounter (Signed)
LMOM instructing Pt to return call.  

## 2020-02-24 NOTE — Telephone Encounter (Signed)
Spoke with patient. She will bring the forms that were filled out tomorrow to make sure  forms are filled out correctly

## 2020-03-24 ENCOUNTER — Ambulatory Visit (INDEPENDENT_AMBULATORY_CARE_PROVIDER_SITE_OTHER): Payer: BC Managed Care – PPO

## 2020-03-24 ENCOUNTER — Other Ambulatory Visit: Payer: Self-pay

## 2020-03-24 DIAGNOSIS — Z23 Encounter for immunization: Secondary | ICD-10-CM

## 2020-03-24 NOTE — Progress Notes (Signed)
Noted  Kloee Ballew R Lowne Chase, DO  

## 2020-03-24 NOTE — Progress Notes (Signed)
Pt here today for 2nd Hep B vaccine.   Energix B 75mL injected into L deltoid. Pt tolerated injection well.   After injection, I realized Pt received Heplisav-B for the first dose. Discussed w/ RN- Selena Batten, per CDC website site, next dose in 8 weeks will need to be Engerix-B. I informed Pt, she was concerned because she needed the 3 dose series for school/clinicals. Explained to her above and gave her immunization record. I wrote on there next in 8 weeks due to my mistake and that she would receive Energix-B for the 3rd and final dose.   Nurse visit scheduled 05/27/2020 at 4pm.

## 2020-03-25 ENCOUNTER — Telehealth: Payer: Self-pay

## 2020-03-25 NOTE — Telephone Encounter (Signed)
Discussed w/ Barbara Cower and Selena Batten- recommend to actually move out 3rd Hep B vaccine until end of December/beginning of January. I will send a message to the Pt via Mychart.

## 2020-06-02 ENCOUNTER — Other Ambulatory Visit: Payer: Self-pay

## 2020-06-02 ENCOUNTER — Ambulatory Visit (INDEPENDENT_AMBULATORY_CARE_PROVIDER_SITE_OTHER): Payer: BC Managed Care – PPO

## 2020-06-02 DIAGNOSIS — Z23 Encounter for immunization: Secondary | ICD-10-CM | POA: Diagnosis not present

## 2020-06-03 ENCOUNTER — Telehealth (INDEPENDENT_AMBULATORY_CARE_PROVIDER_SITE_OTHER): Payer: BC Managed Care – PPO | Admitting: Medical

## 2020-06-03 DIAGNOSIS — J069 Acute upper respiratory infection, unspecified: Secondary | ICD-10-CM | POA: Diagnosis not present

## 2020-06-03 MED ORDER — AZITHROMYCIN 250 MG PO TABS
ORAL_TABLET | ORAL | 0 refills | Status: DC
Start: 2020-06-03 — End: 2020-09-26

## 2020-06-03 MED ORDER — BENZONATATE 100 MG PO CAPS: 100.0000 mg | ORAL_CAPSULE | Freq: Three times a day (TID) | ORAL | 0 refills | Status: DC | PRN

## 2020-06-03 NOTE — Patient Instructions (Addendum)
Upper respiratory infection versus bronchitis.  Recommend using your Flonase nasal spray and benzonatate for cough.  After discussion decided to go ahead and prescribe azithromycin at request of patient.  She expresses concern for bronchitis and notes azithromycin has helped in the past.  If signs and symptoms change or worsen notify us.  Doubt Covid since she has been fully vaccinated and received booster dose.  But her symptoms persist, worsen or change then advised to get over-the-counter Covid test.  Follow-up in 7 [place] or as needed.

## 2020-06-03 NOTE — Progress Notes (Signed)
   Subjective:    Patient ID: Lauren Jones, female    DOB: January 24, 1966, 54 y.o.   MRN: [id number]  HPI  Virtual Visit via Video Note  I connected with Lauren Jones on 06/03/20 at  4:20 PM EST by a video enabled telemedicine application and verified that I am speaking with the correct person using two identifiers.  Location: Patient: home Provider: office  Pt did not check her vitals. No supplies to check.   I discussed the limitations of evaluation and management by telemedicine and the availability of in person appointments. The patient expressed understanding and agreed to proceed.  History of Present Illness:  Pt states on Thursday morning she woke up with st. Then st went away. By Thursday afternoon felt nasal congestion and fatigue. Pt states she vit C supplament and theraflu. Neither really helped.   Pt states has cough that cause transient burn sensation to chest. Has tiny  Headache. Also has some nasal congestion.  Pt 3rd hep b vaccine yesterday. Sunday got covid booster. Pt has had flu vaccine in October.  When she coughs brings up some mucus.  Pt has flonase and benzonatate.  Pt thinks/ask for azithromycin.     Observations/Objective: General-no acute distress, pleasant, oriented. Lungs- on inspection lungs appear unlabored. Neck- no tracheal deviation or jvd on inspection. Neuro- gross motor function appears intact. heent- no sinus pressure on palpation.   Assessment and Plan: Upper respiratory infection versus bronchitis.  Recommend using your Flonase nasal spray and benzonatate for cough.  After discussion decided to go ahead and prescribe azithromycin at request of patient.  She expresses concern for bronchitis and notes azithromycin has helped in the past.  If signs and symptoms change or worsen notify us.  Doubt Covid since she has been fully vaccinated and received booster dose.  But her symptoms persist, worsen or change then advised to get over-the-counter  Covid test.  Follow-up in 7 [place] or as needed.  Esperanza Richters, PA-C  Follow Up Instructions:    I discussed the assessment and treatment plan with the patient. The patient was provided an opportunity to ask questions and all were answered. The patient agreed with the plan and demonstrated an understanding of the instructions.   The patient was advised to call back or seek an in-person evaluation if the symptoms worsen or if the condition fails to improve as anticipated.  Time spent with patient today was 20  minutes which consisted of chart rediew, discussing diagnosis, work up treatment and documentation.   Esperanza Richters, PA-C   Review of Systems     Objective:   Physical Exam        Assessment & Plan:

## 2020-07-27 ENCOUNTER — Other Ambulatory Visit: Payer: Self-pay | Admitting: *Deleted

## 2020-07-27 DIAGNOSIS — R600 Localized edema: Secondary | ICD-10-CM

## 2020-07-27 DIAGNOSIS — I10 Essential (primary) hypertension: Secondary | ICD-10-CM

## 2020-07-27 MED ORDER — HYDROCHLOROTHIAZIDE 25 MG PO TABS: 25.0000 mg | ORAL_TABLET | Freq: Every day | ORAL | 0 refills | Status: DC

## 2020-09-21 ENCOUNTER — Telehealth: Payer: Self-pay | Admitting: Family Medicine

## 2020-09-21 NOTE — Telephone Encounter (Signed)
Patient wants to speak to her nurse. Patient refuse to give me any information

## 2020-09-22 NOTE — Telephone Encounter (Signed)
Pt called back with c/o increased anxiety and needing an appointment. This was scheduled for her and I let her know that if a referral is needed it will be placed at that time. She has been feeling overwhelmed, having symptoms of anxiety attacks, and some palpitations.

## 2020-09-22 NOTE — Telephone Encounter (Signed)
Pt called. Left VM to return call

## 2020-09-26 ENCOUNTER — Encounter: Payer: Self-pay | Admitting: Family Medicine

## 2020-09-26 ENCOUNTER — Other Ambulatory Visit: Payer: Self-pay

## 2020-09-26 ENCOUNTER — Ambulatory Visit (INDEPENDENT_AMBULATORY_CARE_PROVIDER_SITE_OTHER): Payer: BC Managed Care – PPO | Admitting: Family Medicine

## 2020-09-26 VITALS — BP 140/90 | HR 71 | Temp 98.1°F | Resp 20 | Ht 66.0 in | Wt 242.2 lb

## 2020-09-26 DIAGNOSIS — R002 Palpitations: Secondary | ICD-10-CM | POA: Diagnosis not present

## 2020-09-26 DIAGNOSIS — F419 Anxiety disorder, unspecified: Secondary | ICD-10-CM

## 2020-09-26 MED ORDER — ESCITALOPRAM OXALATE 10 MG PO TABS: 10.0000 mg | ORAL_TABLET | Freq: Every day | ORAL | 2 refills | Status: DC

## 2020-09-26 MED ORDER — LORAZEPAM 0.5 MG PO TABS: 0.5000 mg | ORAL_TABLET | Freq: Three times a day (TID) | ORAL | 0 refills | Status: DC | PRN

## 2020-09-26 NOTE — Patient Instructions (Signed)
http://NIMH.NIH.Gov">  Generalized Anxiety Disorder, Adult Generalized anxiety disorder (GAD) is a mental health condition. Unlike normal worries, anxiety related to GAD is not triggered by a specific event. These worries do not fade or get better with time. GAD interferes with relationships, work, and school. GAD symptoms can vary from mild to severe. People with severe GAD can have intense waves of anxiety with physical symptoms that are similar to panic attacks. What are the causes? The exact cause of GAD is not known, but the following are believed to have an impact:  Differences in natural brain chemicals.  Genes passed down from parents to children.  Differences in the way threats are perceived.  Development during childhood.  Personality. What increases the risk? The following factors may make you more likely to develop this condition:  Being female.  Having a family history of anxiety disorders.  Being very shy.  Experiencing very stressful life events, such as the death of a loved one.  Having a very stressful family environment. What are the signs or symptoms? People with GAD often worry excessively about many things in their lives, such as their health and family. Symptoms may also include:  Mental and emotional symptoms: ? Worrying excessively about natural disasters. ? Fear of being late. ? Difficulty concentrating. ? Fears that others are judging your performance.  Physical symptoms: ? Fatigue. ? Headaches, muscle tension, muscle twitches, trembling, or feeling shaky. ? Feeling like your heart is pounding or beating very fast. ? Feeling out of breath or like you cannot take a deep breath. ? Having trouble falling asleep or staying asleep, or experiencing restlessness. ? Sweating. ? Nausea, diarrhea, or irritable bowel syndrome (IBS).  Behavioral symptoms: ? Experiencing erratic moods or irritability. ? Avoidance of new situations. ? Avoidance of  people. ? Extreme difficulty making decisions. How is this diagnosed? This condition is diagnosed based on your symptoms and medical history. You will also have a physical exam. Your health care provider may perform tests to rule out other possible causes of your symptoms. To be diagnosed with GAD, a person must have anxiety that:  Is out of his or her control.  Affects several different aspects of his or her life, such as work and relationships.  Causes distress that makes him or her unable to take part in normal activities.  Includes at least three symptoms of GAD, such as restlessness, fatigue, trouble concentrating, irritability, muscle tension, or sleep problems. Before your health care provider can confirm a diagnosis of GAD, these symptoms must be present more Hillebrand than they are not, and they must last for 6 months or longer. How is this treated? This condition may be treated with:  Medicine. Antidepressant medicine is usually prescribed for long-term daily control. Anti-anxiety medicines may be added in severe cases, especially when panic attacks occur.  Talk therapy (psychotherapy). Certain types of talk therapy can be helpful in treating GAD by providing support, education, and guidance. Options include: ? Cognitive behavioral therapy (CBT). People learn coping skills and self-calming techniques to ease their physical symptoms. They learn to identify unrealistic thoughts and behaviors and to replace them with more appropriate thoughts and behaviors. ? Acceptance and commitment therapy (ACT). This treatment teaches people how to be mindful as a way to cope with unwanted thoughts and feelings. ? Biofeedback. This process trains you to manage your body's response (physiological response) through breathing techniques and relaxation methods. You will work with a therapist while machines are used to monitor your physical   symptoms.  Stress management techniques. These include yoga,  meditation, and exercise. A mental health specialist can help determine which treatment is best for you. Some people see improvement with one type of therapy. However, other people require a combination of therapies.   Follow these instructions at home: Lifestyle  Maintain a consistent routine and schedule.  Anticipate stressful situations. Create a plan, and allow extra time to work with your plan.  Practice stress management or self-calming techniques that you have learned from your therapist or your health care provider. General instructions  Take over-the-counter and prescription medicines only as told by your health care provider.  Understand that you are likely to have setbacks. Accept this and be kind to yourself as you persist to take better care of yourself.  Recognize and accept your accomplishments, even if you judge them as small.  Keep all follow-up visits as told by your health care provider. This is important. Contact a health care provider if:  Your symptoms do not get better.  Your symptoms get worse.  You have signs of depression, such as: ? A persistently sad or irritable mood. ? Loss of enjoyment in activities that used to bring you joy. ? Change in weight or eating. ? Changes in sleeping habits. ? Avoiding friends or family members. ? Loss of energy for normal tasks. ? Feelings of guilt or worthlessness. Get help right away if:  You have serious thoughts about hurting yourself or others. If you ever feel like you may hurt yourself or others, or have thoughts about taking your own life, get help right away. Go to your nearest emergency department or:  Call your local emergency services (911 in the [place]  Call a suicide crisis helpline, such as the National Suicide Prevention Lifeline at 1-800-273-8255. This is open 24 hours a day in the [place]  Text the Crisis Text Line at 741741 (in the [place] Summary  Generalized anxiety disorder (GAD) is a mental  health condition that involves worry that is not triggered by a specific event.  People with GAD often worry excessively about many things in their lives, such as their health and family.  GAD may cause symptoms such as restlessness, trouble concentrating, sleep problems, frequent sweating, nausea, diarrhea, headaches, and trembling or muscle twitching.  A mental health specialist can help determine which treatment is best for you. Some people see improvement with one type of therapy. However, other people require a combination of therapies. This information is not intended to replace advice given to you by your health care provider. Make sure you discuss any questions you have with your health care provider. Document Revised: 04/01/2019 Document Reviewed: 04/01/2019 Elsevier Patient Education  2021 Elsevier Inc.  

## 2020-09-26 NOTE — Assessment & Plan Note (Signed)
Prob due to anxiety

## 2020-09-26 NOTE — Assessment & Plan Note (Signed)
Start meds per orders F/u 1 month or sooner prn  Also gave pt number for [facility]--- for add testing  Pt will start counseling through school next week

## 2020-09-26 NOTE — Progress Notes (Signed)
Patient ID: Lauren Jones, female    DOB: 13-Jan-1966  Age: 55 y.o. MRN: [id number]    Subjective:  Subjective  HPI Lauren Jones presents for an office visit today.  She complains of increase in anxiety that started 2 weeks ago. She states that she has been crying a lot and had been overwhelmed. She notes that communication with her instructor at school has stressed her out and is considering dropping out.  She reports that talking to people had relieve her symptoms, however it worsen when she is alone. She endorses talking to a counselor and is planning on attending a session with one. She also complains of heart palpitations secondary to her anxiety that started 2 weeks ago. She denies any SOB, fever, abdominal pain, cough, chills, sore throat, dysuria, urinary incontinence, back pain, HA, or N/VD at this time.  Review of Systems  Constitutional: Negative for chills, fatigue and fever.  HENT: Negative for congestion, ear pain, sinus pressure, sinus pain and sore throat.   Eyes: Negative for pain.  Respiratory: Negative for cough and shortness of breath.   Cardiovascular: Positive for palpitations (secondary to stress). Negative for leg swelling.  Gastrointestinal: Negative for abdominal pain, blood in stool, constipation, diarrhea, nausea and vomiting.  Genitourinary: Negative for dysuria, frequency, hematuria and urgency.  Musculoskeletal: Negative for back pain.  Skin: Negative for rash.  Neurological: Negative for headaches.  Psychiatric/Behavioral: The patient is nervous/anxious (Secondary to school).     History Past Medical History:  Diagnosis Date  . Hypothyroidism     She has a past surgical history that includes Thyroidectomy (Bilateral, 2014-2015?).   Her family history includes Colon cancer in her paternal grandmother; Hypertension in her father and mother; Sarcoidosis in her father.She reports that she has quit smoking. Her smoking use included cigarettes. She has never used smokeless  tobacco. She reports current alcohol use. She reports that she does not use drugs.  Current Outpatient Medications on File Prior to Visit  Medication Sig Dispense Refill  . albuterol (PROVENTIL HFA;VENTOLIN HFA) 108 (90 Base) MCG/ACT inhaler Inhale 2 puffs into the lungs every 6 (six) hours as needed for wheezing or shortness of breath. 1 Inhaler 2  . benzonatate (TESSALON) 100 MG capsule Take 1 capsule (100 mg total) by mouth 3 (three) times daily as needed for cough. 30 capsule 0  . Cholecalciferol (VITAMIN D3) 1000 units CAPS Take 1 capsule (1,000 Units total) by mouth daily. 90 capsule 1  . cyclobenzaprine (FLEXERIL) 10 MG tablet Take 1 tablet (10 mg total) by mouth daily. 30 tablet 1  . fluticasone (FLONASE) 50 MCG/ACT nasal spray Place 2 sprays into both nostrils daily. 16 g 6  . hydrochlorothiazide (HYDRODIURIL) 25 MG tablet Take 1 tablet (25 mg total) by mouth daily. Pt needs OV for further refills 15 tablet 0  . levothyroxine (SYNTHROID) 200 MCG tablet Take 1 tablet (200 mcg total) by mouth daily before breakfast. 30 tablet 2  . megestrol (MEGACE) 40 MG tablet Take 1 tablets daily 90 tablet 5  . ORACEA 40 MG capsule Take 40 mg by mouth daily.    . phentermine (ADIPEX-P) 37.5 MG tablet Take by mouth.     Current Facility-Administered Medications on File Prior to Visit  Medication Dose Route Frequency Provider Last Rate Last Admin  . cyanocobalamin ((VITAMIN B-12)) injection 1,000 mcg  1,000 mcg Intramuscular Q30 Gilden Zola Button, Myrene Buddy R, DO   1,000 mcg at 07/31/19 1547     Objective:  Objective  Physical Exam  Vitals and nursing note reviewed.  Constitutional:      General: She is not in acute distress.    Appearance: Normal appearance. She is well-developed. She is not ill-appearing.  HENT:     Head: Normocephalic and atraumatic.     Right Ear: External ear normal.     Left Ear: External ear normal.     Nose: Nose normal.  Eyes:     Extraocular Movements: Extraocular  movements intact.     Pupils: Pupils are equal, round, and reactive to light.  Cardiovascular:     Rate and Rhythm: Normal rate and regular rhythm.     Pulses: Normal pulses.     Heart sounds: Normal heart sounds. No murmur heard. No friction rub. No gallop.   Pulmonary:     Effort: Pulmonary effort is normal. No respiratory distress.     Breath sounds: Normal breath sounds. No wheezing, rhonchi or rales.  Abdominal:     General: Bowel sounds are normal. There is no distension.     Palpations: Abdomen is soft.     Tenderness: There is no abdominal tenderness. There is no guarding.     Hernia: No hernia is present.  Musculoskeletal:        General: Normal range of motion.     Cervical back: Normal range of motion and neck supple.  Skin:    General: Skin is warm and dry.  Neurological:     Mental Status: She is alert and oriented to person, place, and time.  Psychiatric:        Mood and Affect: Mood is anxious.        Behavior: Behavior normal.        Thought Content: Thought content normal.    BP 140/90 (BP Location: Right Arm, Patient Position: Sitting, Cuff Size: Large)   Pulse 71   Temp 98.1 F (36.7 C) (Oral)   Resp 20   Ht 5\' 6"  (1.676 m)   Wt 242 lb 3.2 oz (109.9 kg)   SpO2 97%   BMI 39.09 kg/m  Wt Readings from Last 3 Encounters:  09/26/20 242 lb 3.2 oz (109.9 kg)  02/03/20 240 lb (108.9 kg)  12/23/19 236 lb (107 kg)     Lab Results  Component Value Date   WBC 7.7 02/24/2019   HGB 12.2 02/24/2019   HCT 37.0 02/24/2019   PLT 317.0 02/24/2019   GLUCOSE 78 12/22/2018   CHOL 188 12/22/2018   TRIG 137.0 12/22/2018   HDL 50.50 12/22/2018   LDLCALC 110 (H) 12/22/2018   ALT 17 12/22/2018   AST 17 12/22/2018   NA 137 12/22/2018   K 3.6 12/22/2018   CL 99 12/22/2018   CREATININE 0.96 12/22/2018   BUN 12 12/22/2018   CO2 29 12/22/2018   TSH 0.25 (L) 02/24/2019   HGBA1C 5.8 12/22/2018   EKG----sinus brady--- no change since 02/2018  No results found.    Assessment & Plan:  Plan    Meds ordered this encounter  Medications  . escitalopram (LEXAPRO) 10 MG tablet    Sig: Take 1 tablet (10 mg total) by mouth daily.    Dispense:  30 tablet    Refill:  2  . LORazepam (ATIVAN) 0.5 MG tablet    Sig: Take 1 tablet (0.5 mg total) by mouth every 8 (eight) hours as needed for anxiety.    Dispense:  30 tablet    Refill:  0    Problem List Items Addressed This Visit  None   Visit Diagnoses    Anxiety    -  Primary   Relevant Medications   escitalopram (LEXAPRO) 10 MG tablet   LORazepam (ATIVAN) 0.5 MG tablet   Other Relevant Orders   CBC with Differential/Platelet   TSH   Vitamin B12   Comprehensive metabolic panel   Palpitations       Relevant Medications   LORazepam (ATIVAN) 0.5 MG tablet   Other Relevant Orders   EKG 12-Lead (Completed)   CBC with Differential/Platelet   TSH   Vitamin B12   Comprehensive metabolic panel      Follow-up: Return in about 4 weeks (around 10/24/2020).   I,Gordon Zheng,acting as a Neurosurgeon for [employer], DO.,have documented all relevant documentation on the behalf of Donato Schultz, DO,as directed by  Donato Schultz, DO while in the presence of Donato Schultz, DO.  I, Donato Schultz, DO, have reviewed all documentation for this visit. The documentation on 09/26/20 for the exam, diagnosis, procedures, and orders are all accurate and complete.  Donato Schultz, DO

## 2020-09-27 ENCOUNTER — Telehealth: Payer: Self-pay | Admitting: Family Medicine

## 2020-09-27 LAB — TSH: TSH: 27.97 u[IU]/mL — ABNORMAL HIGH (ref 0.35–4.50)

## 2020-09-27 LAB — VITAMIN B12: Vitamin B-12: 207 pg/mL — ABNORMAL LOW (ref 211–911)

## 2020-09-27 LAB — CBC WITH DIFFERENTIAL/PLATELET
Basophils Absolute: 0.2 10*3/uL — ABNORMAL HIGH (ref 0.0–0.1)
Basophils Relative: 1.7 % (ref 0.0–3.0)
Eosinophils Absolute: 0.2 10*3/uL (ref 0.0–0.7)
Eosinophils Relative: 1.6 % (ref 0.0–5.0)
HCT: 35.3 % — ABNORMAL LOW (ref 36.0–46.0)
Hemoglobin: 11.7 g/dL — ABNORMAL LOW (ref 12.0–15.0)
Lymphocytes Relative: 28.8 % (ref 12.0–46.0)
Lymphs Abs: 2.9 10*3/uL (ref 0.7–4.0)
MCHC: 33.1 g/dL (ref 30.0–36.0)
MCV: 79.3 fl (ref 78.0–100.0)
Monocytes Absolute: 0.7 10*3/uL (ref 0.1–1.0)
Monocytes Relative: 7.4 % (ref 3.0–12.0)
Neutro Abs: 6.1 10*3/uL (ref 1.4–7.7)
Neutrophils Relative %: 60.5 % (ref 43.0–77.0)
Platelets: 315 10*3/uL (ref 150.0–400.0)
RBC: 4.46 Mil/uL (ref 3.87–5.11)
RDW: 16.3 % — ABNORMAL HIGH (ref 11.5–15.5)
WBC: 10.1 10*3/uL (ref 4.0–10.5)

## 2020-09-27 LAB — COMPREHENSIVE METABOLIC PANEL
ALT: 17 U/L (ref 0–35)
AST: 16 U/L (ref 0–37)
Albumin: 4.2 g/dL (ref 3.5–5.2)
Alkaline Phosphatase: 75 U/L (ref 39–117)
BUN: 8 mg/dL (ref 6–23)
CO2: 28 mEq/L (ref 19–32)
Calcium: 9.3 mg/dL (ref 8.4–10.5)
Chloride: 104 mEq/L (ref 96–112)
Creatinine, Ser: 1.06 mg/dL (ref 0.40–1.20)
GFR: 59.9 mL/min — ABNORMAL LOW (ref >60.00)
Glucose, Bld: 90 mg/dL (ref 70–99)
Potassium: 3.6 mEq/L (ref 3.5–5.1)
Sodium: 139 mEq/L (ref 135–145)
Total Bilirubin: 0.5 mg/dL (ref 0.2–1.2)
Total Protein: 6.7 g/dL (ref 6.0–8.3)

## 2020-09-27 NOTE — Telephone Encounter (Signed)
Pt came in office stating is needing a letter from provider indicating that she is taking meds for her anxiety that is caused by school stress, also to say that it is causing pt to have high BP from the anxiety and that it can effect her now and in the future (this is for school to make arrangement with pt to have more time for her assessment.  Please contact pt at [phone] to pick up the letter (pt mentioned will be around the area on Thursday afternoon and can pick up letter this day- pt was informed that will be called first) Please advise.

## 2020-09-28 ENCOUNTER — Other Ambulatory Visit: Payer: Self-pay | Admitting: Family Medicine

## 2020-09-28 DIAGNOSIS — E538 Deficiency of other specified B group vitamins: Secondary | ICD-10-CM

## 2020-09-28 NOTE — Telephone Encounter (Signed)
In mychart 

## 2020-09-28 NOTE — Telephone Encounter (Signed)
Pt notified of letter. Pt requested letter be placed at the front for pick up. Letter placed up front

## 2020-09-29 ENCOUNTER — Telehealth: Payer: Self-pay

## 2020-09-29 ENCOUNTER — Other Ambulatory Visit: Payer: Self-pay | Admitting: Family Medicine

## 2020-09-29 DIAGNOSIS — E039 Hypothyroidism, unspecified: Secondary | ICD-10-CM

## 2020-09-29 MED ORDER — LEVOTHYROXINE SODIUM 175 MCG PO TABS: 175.0000 ug | ORAL_TABLET | Freq: Every day | ORAL | 3 refills | Status: DC

## 2020-09-29 NOTE — Telephone Encounter (Signed)
Patient came into office wanting to go over labs , lab letter made

## 2020-10-28 ENCOUNTER — Other Ambulatory Visit: Payer: Self-pay

## 2020-10-28 MED ORDER — LEVOTHYROXINE SODIUM 175 MCG PO TABS: 175.0000 ug | ORAL_TABLET | Freq: Every day | ORAL | 3 refills | Status: DC

## 2020-10-31 ENCOUNTER — Ambulatory Visit: Payer: BC Managed Care – PPO | Admitting: Family Medicine

## 2020-11-07 ENCOUNTER — Telehealth: Payer: Self-pay | Admitting: Family Medicine

## 2020-11-07 ENCOUNTER — Other Ambulatory Visit: Payer: Self-pay

## 2020-11-07 MED ORDER — LEVOTHYROXINE SODIUM 175 MCG PO TABS: 175.0000 ug | ORAL_TABLET | Freq: Every day | ORAL | 3 refills | Status: DC

## 2020-11-07 NOTE — Telephone Encounter (Signed)
Rx reprinted to read "brand name only"    Left message on vm to inform pt.

## 2020-11-07 NOTE — Telephone Encounter (Signed)
Patient states she needs BRAND NAME ONLY  (SYNTHROID) 175 MCG tablet   Patient will pick up rx today. Please call when ready.  The previous rx was written incorrectly

## 2020-11-23 ENCOUNTER — Other Ambulatory Visit: Payer: Self-pay

## 2020-11-23 DIAGNOSIS — I10 Essential (primary) hypertension: Secondary | ICD-10-CM

## 2020-11-23 DIAGNOSIS — R600 Localized edema: Secondary | ICD-10-CM

## 2020-11-23 MED ORDER — HYDROCHLOROTHIAZIDE 25 MG PO TABS: 25.0000 mg | ORAL_TABLET | Freq: Every day | ORAL | 1 refills | Status: DC

## 2020-11-29 ENCOUNTER — Other Ambulatory Visit: Payer: Self-pay

## 2020-11-29 ENCOUNTER — Ambulatory Visit (INDEPENDENT_AMBULATORY_CARE_PROVIDER_SITE_OTHER): Payer: BC Managed Care – PPO | Admitting: Family Medicine

## 2020-11-29 ENCOUNTER — Encounter: Payer: Self-pay | Admitting: Family Medicine

## 2020-11-29 ENCOUNTER — Telehealth: Payer: Self-pay | Admitting: *Deleted

## 2020-11-29 VITALS — BP 120/88 | HR 62 | Temp 97.9°F | Resp 18 | Ht 65.0 in | Wt 249.0 lb

## 2020-11-29 DIAGNOSIS — E538 Deficiency of other specified B group vitamins: Secondary | ICD-10-CM | POA: Diagnosis not present

## 2020-11-29 DIAGNOSIS — R600 Localized edema: Secondary | ICD-10-CM

## 2020-11-29 DIAGNOSIS — R002 Palpitations: Secondary | ICD-10-CM | POA: Diagnosis not present

## 2020-11-29 DIAGNOSIS — R5383 Other fatigue: Secondary | ICD-10-CM | POA: Diagnosis not present

## 2020-11-29 DIAGNOSIS — F419 Anxiety disorder, unspecified: Secondary | ICD-10-CM | POA: Diagnosis not present

## 2020-11-29 DIAGNOSIS — E039 Hypothyroidism, unspecified: Secondary | ICD-10-CM

## 2020-11-29 NOTE — Assessment & Plan Note (Signed)
con't synthroid and recheck labs in 5 weeks

## 2020-11-29 NOTE — Telephone Encounter (Signed)
Spoke with patient appointment confirmed

## 2020-11-29 NOTE — Progress Notes (Signed)
Subjective:   By signing my name below, I, Shehryar Baig, attest that this documentation has been prepared under the direction and in the presence of Dr. Seabron Spates, DO. 11/29/2020      Patient ID: Lauren Jones, female    DOB: [birth year], 55 y.o.   MRN: [id number]  Chief Complaint  Patient presents with  . Anxiety  . Palpitations  . 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  She does not want to take the medication ---- she stopped the lexapro  She states she has been very tired but has not started the b12 and has only been on the new dose of synthroid for 3 weeks -- she had trouble getting it from the pharmacy    Past Medical History:  Diagnosis Date  . Hypothyroidism     Past Surgical History:  Procedure Laterality Date  . THYROIDECTOMY Bilateral 2014-2015?    Family History  Problem Relation Age of Onset  . Hypertension Mother   . Hypertension Father   . Sarcoidosis Father   . Colon cancer Paternal Grandmother     Social History   Socioeconomic History  . Marital status: Single    Spouse name: Not on file  . Number of children: Not on file  . Years of education: Not on file  . Highest education level: Not on file  Occupational History  . Not on file  Tobacco Use  . Smoking status: Former Smoker    Types: Cigarettes  . Smokeless tobacco: Never Used  Substance and Sexual Activity  . Alcohol use: Yes    Comment: social  . Drug use: Never  . Sexual activity: Not Currently  Other Topics Concern  . Not on file  Social History Narrative  . Not on file   Social Determinants of Health   Financial Resource Strain: Not on file  Food Insecurity: Not on file  Transportation Needs: Not on file  Physical Activity: Not on file  Stress: Not on file  Social Connections: Not on file  Intimate Partner Violence:  Not on file    Outpatient Medications Prior to Visit  Medication Sig Dispense Refill  . benzonatate (TESSALON) 100 MG capsule Take 1 capsule (100 mg total) by mouth 3 (three) times daily as needed for cough. 30 capsule 0  . cyclobenzaprine (FLEXERIL) 10 MG tablet Take 1 tablet (10 mg total) by mouth daily. 30 tablet 1  . fluticasone (FLONASE) 50 MCG/ACT nasal spray Place 2 sprays into both nostrils daily. 16 g 6  . hydrochlorothiazide (HYDRODIURIL) 25 MG tablet Take 1 tablet (25 mg total) by mouth daily. 90 tablet 1  . levothyroxine (SYNTHROID) 175 MCG tablet Take 1 tablet (175 mcg total) by mouth daily. Brand name only. 90 tablet 3  . LORazepam (ATIVAN) 0.5 MG tablet Take 1 tablet (0.5 mg total) by mouth every 8 (eight) hours as needed for anxiety. 30 tablet 0  . megestrol (MEGACE) 40 MG tablet Take 1 tablets daily 90 tablet 5  . ORACEA 40 MG capsule Take 40 mg by mouth daily.    Marland Kitchen albuterol (PROVENTIL HFA;VENTOLIN HFA) 108 (90 Base) MCG/ACT inhaler Inhale 2 puffs into the lungs every 6 (six) hours as needed for wheezing or shortness of breath. (Patient not taking: Reported on 11/29/2020) 1 Inhaler 2  .  Cholecalciferol (VITAMIN D3) 1000 units CAPS Take 1 capsule (1,000 Units total) by mouth daily. (Patient not taking: Reported on 11/29/2020) 90 capsule 1  . escitalopram (LEXAPRO) 10 MG tablet Take 1 tablet (10 mg total) by mouth daily. (Patient not taking: Reported on 11/29/2020) 30 tablet 2  . phentermine (ADIPEX-P) 37.5 MG tablet Take by mouth.     Facility-Administered Medications Prior to Visit  Medication Dose Route Frequency Provider Last Rate Last Admin  . cyanocobalamin ((VITAMIN B-12)) injection 1,000 mcg  1,000 mcg Intramuscular Q30 Daisey Zola Button, Myrene Buddy R, DO   1,000 mcg at 07/31/19 1547    Allergies  Allergen Reactions  . Bacitracin Itching  . Furosemide Other (See Comments)    Headache  . Latex Itching    Review of Systems  Constitutional: Positive for malaise/fatigue.  Negative for chills and fever.  HENT: Negative for congestion and hearing loss.   Eyes: Negative for discharge.  Respiratory: Negative for cough, sputum production and shortness of breath.   Cardiovascular: Positive for leg swelling. Negative for chest pain and palpitations.  Gastrointestinal: Negative for abdominal pain, blood in stool, constipation, diarrhea, heartburn, nausea and vomiting.  Genitourinary: Negative for dysuria, frequency, hematuria and urgency.  Musculoskeletal: Negative for back pain, falls and myalgias.  Skin: Negative for rash.  Neurological: Negative for dizziness, sensory change, loss of consciousness, weakness and headaches.  Endo/Heme/Allergies: Negative for environmental allergies. Does not bruise/bleed easily.  Psychiatric/Behavioral: Negative for depression and suicidal ideas. The patient is not nervous/anxious and does not have insomnia.        Objective:    Physical Exam Vitals and nursing note reviewed.  Constitutional:      Appearance: She is well-developed.  HENT:     Head: Normocephalic and atraumatic.  Eyes:     Conjunctiva/sclera: Conjunctivae normal.  Neck:     Thyroid: No thyromegaly.     Vascular: No carotid bruit or JVD.  Cardiovascular:     Rate and Rhythm: Normal rate and regular rhythm.     Heart sounds: Normal heart sounds. No murmur heard.   Pulmonary:     Effort: Pulmonary effort is normal. No respiratory distress.     Breath sounds: Normal breath sounds. No wheezing or rales.  Chest:     Chest wall: No tenderness.  Musculoskeletal:        General: Swelling present. No tenderness.     Cervical back: Normal range of motion and neck supple.     Right lower leg: Edema present.     Left lower leg: Edema present.  Neurological:     Mental Status: She is alert and oriented to person, place, and time.  Psychiatric:        Attention and Perception: Attention and perception normal.        Mood and Affect: Mood and affect normal.         Behavior: Behavior normal. Behavior is cooperative.        Thought Content: Thought content normal.        Cognition and Memory: Cognition normal.     BP 120/88 (BP Location: Right Arm, Patient Position: Sitting, Cuff Size: Large)   Pulse 62   Temp 97.9 F (36.6 C) (Oral)   Resp 18   Ht 5\' 5"  (1.651 m)   Wt 249 lb (112.9 kg)   SpO2 98%   BMI 41.44 kg/m  Wt Readings from Last 3 Encounters:  11/29/20 249 lb (112.9 kg)  09/26/20 242 lb 3.2 oz (109.9  kg)  02/03/20 240 lb (108.9 kg)    Diabetic Foot Exam - Simple   No data filed    Lab Results  Component Value Date   WBC 10.1 09/26/2020   HGB 11.7 (L) 09/26/2020   HCT 35.3 (L) 09/26/2020   PLT 315.0 09/26/2020   GLUCOSE 90 09/26/2020   CHOL 188 12/22/2018   TRIG 137.0 12/22/2018   HDL 50.50 12/22/2018   LDLCALC 110 (H) 12/22/2018   ALT 17 09/26/2020   AST 16 09/26/2020   NA 139 09/26/2020   K 3.6 09/26/2020   CL 104 09/26/2020   CREATININE 1.06 09/26/2020   BUN 8 09/26/2020   CO2 28 09/26/2020   TSH 27.97 (H) 09/26/2020   HGBA1C 5.8 12/22/2018    Lab Results  Component Value Date   TSH 27.97 (H) 09/26/2020   Lab Results  Component Value Date   WBC 10.1 09/26/2020   HGB 11.7 (L) 09/26/2020   HCT 35.3 (L) 09/26/2020   MCV 79.3 09/26/2020   PLT 315.0 09/26/2020   Lab Results  Component Value Date   NA 139 09/26/2020   K 3.6 09/26/2020   CO2 28 09/26/2020   GLUCOSE 90 09/26/2020   BUN 8 09/26/2020   CREATININE 1.06 09/26/2020   BILITOT 0.5 09/26/2020   ALKPHOS 75 09/26/2020   AST 16 09/26/2020   ALT 17 09/26/2020   PROT 6.7 09/26/2020   ALBUMIN 4.2 09/26/2020   CALCIUM 9.3 09/26/2020   GFR 59.90 (L) 09/26/2020   Lab Results  Component Value Date   CHOL 188 12/22/2018   Lab Results  Component Value Date   HDL 50.50 12/22/2018   Lab Results  Component Value Date   LDLCALC 110 (H) 12/22/2018   Lab Results  Component Value Date   TRIG 137.0 12/22/2018   Lab Results  Component  Value Date   CHOLHDL 4 12/22/2018   Lab Results  Component Value Date   HGBA1C 5.8 12/22/2018       Assessment & Plan:   Problem List Items Addressed This Visit      Unprioritized   Anxiety - Primary    Resolved with counseling and the teacher leaving  D/c lexapro and ativan      B12 deficiency    b12 #1 today--- rto 1 week for b1 2      Hypothyroidism    con't synthroid and recheck labs in 5 weeks       Lower extremity edema    Pt wants to hold off on changing meds until we get the thyroid under control She will contact her endo      Other fatigue   Palpitations    Improved con't metoprolol           No orders of the defined types were placed in this encounter.   I, Dr. Seabron Spates, DO, personally preformed the services described in this documentation.  All medical record entries made by the scribe were at my direction and in my presence.  I have reviewed the chart and discharge instructions (if applicable) and agree that the record reflects my personal performance and is accurate and complete. Dr. Seabron Spates, DO   I,Shehryar Baig,acting as a scribe for Donato Schultz, DO.,have documented all relevant documentation on the behalf of Donato Schultz, DO,as directed by  Donato Schultz, DO while in the presence of Donato Schultz, DO.   Donato Schultz, DO

## 2020-11-29 NOTE — Assessment & Plan Note (Signed)
Pt wants to hold off on changing meds until we get the thyroid under control She will contact her endo

## 2020-11-29 NOTE — Assessment & Plan Note (Signed)
Improved con't metoprolol

## 2020-11-29 NOTE — Telephone Encounter (Signed)
Who Is Calling Patient / Member / Family / Caregiver Caller Name Andi Layfield Caller Phone Number [phone] Patient Name Lauren Jones Patient DOB 1966/05/14 Call Type Message Only Information Provided Reason for Call Request for General Office Information Initial Comment caller needing to confirm her appt time today Disp. Time Disposition Final User 11/29/2020 7:28:50 AM General Information Provided Yes Tiburcio Pea, Lanette

## 2020-11-29 NOTE — Assessment & Plan Note (Signed)
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

## 2020-11-29 NOTE — Assessment & Plan Note (Signed)
b12 #1 today--- rto 1 week for b1 2

## 2020-11-29 NOTE — Patient Instructions (Signed)

## 2020-12-06 ENCOUNTER — Other Ambulatory Visit: Payer: Self-pay

## 2020-12-06 ENCOUNTER — Ambulatory Visit (INDEPENDENT_AMBULATORY_CARE_PROVIDER_SITE_OTHER): Payer: BC Managed Care – PPO

## 2020-12-06 DIAGNOSIS — E538 Deficiency of other specified B group vitamins: Secondary | ICD-10-CM | POA: Diagnosis not present

## 2020-12-06 MED ORDER — CYANOCOBALAMIN 1000 MCG/ML IJ SOLN
1000.0000 ug | Freq: Once | INTRAMUSCULAR | Status: AC
  Administered 2020-12-06: 1000 ug via INTRAMUSCULAR

## 2020-12-06 NOTE — Progress Notes (Signed)
Lauren Jones is a 55 y.o. female presents to the office today for weekly b12 injection, per physician's orders. Original order: 09/26/2020 Cyanocobalamin (med), 1,000 mcg (dose),  IM (route) was administered left deltoid (location) today. Patient tolerated injection.  Patient next injection due: 12/13/2020, appt made Yes  [name]

## 2020-12-13 ENCOUNTER — Other Ambulatory Visit: Payer: Self-pay

## 2020-12-13 ENCOUNTER — Ambulatory Visit (INDEPENDENT_AMBULATORY_CARE_PROVIDER_SITE_OTHER): Payer: BC Managed Care – PPO

## 2020-12-13 DIAGNOSIS — E538 Deficiency of other specified B group vitamins: Secondary | ICD-10-CM

## 2020-12-13 MED ORDER — CYANOCOBALAMIN 1000 MCG/ML IJ SOLN
1000.0000 ug | Freq: Once | INTRAMUSCULAR | Status: AC
  Administered 2020-12-13: 1000 ug via INTRAMUSCULAR

## 2020-12-13 NOTE — Progress Notes (Signed)
Lauren Jones is a 55 y.o. female presents to the office today for wekly b12 per physician's orders. Original order: 09/26/2020 cyanocobalamin (med), 1000 (dose),  IM (route) was administered right deltoid (location) today. Patient tolerated injection. Patient next injection due: next week, appt made Yes  [name]

## 2020-12-20 ENCOUNTER — Ambulatory Visit: Payer: BC Managed Care – PPO

## 2020-12-27 ENCOUNTER — Ambulatory Visit (INDEPENDENT_AMBULATORY_CARE_PROVIDER_SITE_OTHER): Payer: BC Managed Care – PPO

## 2020-12-27 ENCOUNTER — Other Ambulatory Visit: Payer: Self-pay

## 2020-12-27 DIAGNOSIS — E538 Deficiency of other specified B group vitamins: Secondary | ICD-10-CM | POA: Diagnosis not present

## 2020-12-27 MED ORDER — CYANOCOBALAMIN 1000 MCG/ML IJ SOLN
1000.0000 ug | Freq: Once | INTRAMUSCULAR | Status: AC
  Administered 2020-12-27: 1000 ug via INTRAMUSCULAR

## 2020-12-27 NOTE — Progress Notes (Signed)
Lauren Jones is a 55 y.o. female presents to the office today for weekly b12 injection, per physician's orders. Original order: 11/29/2020 cyanocobalamin (med), 1,000 mcg (dose),  IM (route) was administered left deltoid (location) today. Patient tolerated injection. Patient next injection due: one month, appt made Yes  [name]

## 2021-01-03 ENCOUNTER — Other Ambulatory Visit (INDEPENDENT_AMBULATORY_CARE_PROVIDER_SITE_OTHER): Payer: BC Managed Care – PPO

## 2021-01-03 ENCOUNTER — Other Ambulatory Visit: Payer: Self-pay

## 2021-01-03 DIAGNOSIS — E538 Deficiency of other specified B group vitamins: Secondary | ICD-10-CM

## 2021-01-03 DIAGNOSIS — E039 Hypothyroidism, unspecified: Secondary | ICD-10-CM

## 2021-01-03 LAB — VITAMIN B12: Vitamin B-12: 696 pg/mL (ref 211–911)

## 2021-01-05 LAB — THYROID PANEL WITH TSH
Free Thyroxine Index: 1.5 (ref 1.4–3.8)
T3 Uptake: 25 % (ref 22–35)
T4, Total: 5.9 ug/dL (ref 5.1–11.9)
TSH: 44.81 mIU/L — ABNORMAL HIGH

## 2021-01-06 ENCOUNTER — Other Ambulatory Visit: Payer: Self-pay

## 2021-01-06 MED ORDER — LEVOTHYROXINE SODIUM 200 MCG PO TABS: 200.0000 ug | ORAL_TABLET | Freq: Every day | ORAL | 2 refills | Status: DC

## 2021-01-31 ENCOUNTER — Ambulatory Visit: Payer: BC Managed Care – PPO

## 2021-03-03 ENCOUNTER — Other Ambulatory Visit: Payer: Self-pay | Admitting: *Deleted

## 2021-03-03 DIAGNOSIS — E039 Hypothyroidism, unspecified: Secondary | ICD-10-CM

## 2021-03-07 ENCOUNTER — Other Ambulatory Visit: Payer: BC Managed Care – PPO

## 2021-03-15 ENCOUNTER — Telehealth (INDEPENDENT_AMBULATORY_CARE_PROVIDER_SITE_OTHER): Payer: BC Managed Care – PPO | Admitting: Medical

## 2021-03-15 ENCOUNTER — Other Ambulatory Visit: Payer: Self-pay

## 2021-03-15 DIAGNOSIS — J0120 Acute ethmoidal sinusitis, unspecified: Secondary | ICD-10-CM | POA: Diagnosis not present

## 2021-03-15 DIAGNOSIS — R059 Cough, unspecified: Secondary | ICD-10-CM | POA: Diagnosis not present

## 2021-03-15 DIAGNOSIS — U071 COVID-19: Secondary | ICD-10-CM | POA: Diagnosis not present

## 2021-03-15 MED ORDER — MOLNUPIRAVIR EUA 200MG CAPSULE: 4.0000 | ORAL_CAPSULE | Freq: Two times a day (BID) | ORAL | 0 refills | Status: AC

## 2021-03-15 MED ORDER — AZITHROMYCIN 250 MG PO TABS: ORAL_TABLET | ORAL | 0 refills | Status: AC

## 2021-03-15 MED ORDER — BENZONATATE 100 MG PO CAPS: 100.0000 mg | ORAL_CAPSULE | Freq: Three times a day (TID) | ORAL | 0 refills | Status: DC | PRN

## 2021-03-15 NOTE — Patient Instructions (Signed)
COVID infection with risk for 3 and patient vaccinated x2.  Mild to moderate symptoms.  Third day since symptom onset.  Some early onset secondary sinus infection symptoms and concern for bronchitis.  Reporting some productive cough.  Decided to go ahead and prescribe azithromycin antibiotic and benzonatate for cough.  Patient does have Flonase nasal spray at home.  Discussed benefit versus risk of antivirals.  Explained emergency authorization use.  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  Explained sending over molnupiravir.  Explained best time to take antiviral is within 5 [place] of symptom onset.  Patient had questions and we discussed COVID rebound post antiviral.  Explained to patient I do not think it will happen but is possibility.  Asked patient to get O2 sat monitor check oxygen saturations daily.  Want her O2 sats to be 96% or above. Also asked to check blood pressure and pulse and send me an update by MyChart.  Went over return to work/getting out of quarantine guidelines per [employer]. Follow-up in 7 [place] or sooner if needed.

## 2021-03-15 NOTE — Progress Notes (Signed)
   Subjective:    Patient ID: Lauren Jones, female    DOB: 1966-04-18, 55 y.o.   MRN: [id number]  HPI  Virtual Visit via Video Note  I connected with Lauren Jones on 03/15/21 at  4:20 PM EDT by a video enabled telemedicine application and verified that I am speaking with the correct person using two identifiers.  Location: Patient: home Provider: office   I discussed the limitations of evaluation and management by telemedicine and the availability of in person appointments. The patient expressed understanding and agreed to proceed.  History of Present Illness: Pt recently diagnosed with covid 03-14-2021  Test used- otc yesterday.  History of covid vaccine x 3.  Covid risk score- 3  History of covid infection- No diagnosis in past.   Current symptoms-sneezing, subjective fever, chills, sweaty, bodyaches, nasal congested, productive cough at times. One time got blood tinged mucus. Symptoms started on Monday evening. Tuesday when noticed severity. Moderate fatigue. No shortness of breath or wheezing.    Pt 02 sat-does not have one. Advised to get one over the counter.   Pt works in [place]. Going to [employer].    Observations/Objective: General-no acute distress, pleasant, oriented. Lungs- on inspection lungs appear unlabored. Neck- no tracheal deviation or jvd on inspection. Neuro- gross motor function appears intact.  Heent- self palpation over ethmoid sinus discomfort.   Assessment and Plan:  Patient Instructions  COVID infection with risk for 3 and patient vaccinated x2.  Mild to moderate symptoms.  Third day since symptom onset.  Some early onset secondary sinus infection symptoms and concern for bronchitis.  Reporting some productive cough.  Decided to go ahead and prescribe azithromycin antibiotic and benzonatate for cough.  Patient does have Flonase nasal spray at home.  Discussed benefit versus risk of antivirals.  Explained emergency authorization use.   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  Explained sending over molnupiravir.  Explained best time to take antiviral is within 5 [place] of symptom onset.  Patient had questions and we discussed COVID rebound post antiviral.  Explained to patient I do not think it will happen but is possibility.  Asked patient to get O2 sat monitor check oxygen saturations daily.  Want her O2 sats to be 96% or above. Also asked to check blood pressure and pulse and send me an update by MyChart.  Went over return to work/getting out of quarantine guidelines per [employer]. Follow-up in 7 [place] or sooner if needed.   Esperanza Richters, PA-C  Follow Up Instructions:    I discussed the assessment and treatment plan with the patient. The patient was provided an opportunity to ask questions and all were answered. The patient agreed with the plan and demonstrated an understanding of the instructions.   The patient was advised to call back or seek an in-person evaluation if the symptoms worsen or if the condition fails to improve as anticipated.  Time spent with patient today was 30  minutes which consisted of chart revdew, discussing diagnosis, work up ,treatment, answering questions and documentation.    Esperanza Richters, PA-C   Review of Systems     Objective:   Physical Exam        Assessment & Plan:

## 2021-06-06 ENCOUNTER — Ambulatory Visit (INDEPENDENT_AMBULATORY_CARE_PROVIDER_SITE_OTHER): Payer: BC Managed Care – PPO | Admitting: Podiatry

## 2021-06-06 ENCOUNTER — Other Ambulatory Visit: Payer: Self-pay

## 2021-06-06 DIAGNOSIS — M79675 Pain in left toe(s): Secondary | ICD-10-CM | POA: Diagnosis not present

## 2021-06-06 DIAGNOSIS — L600 Ingrowing nail: Secondary | ICD-10-CM | POA: Diagnosis not present

## 2021-06-06 MED ORDER — CEPHALEXIN 500 MG PO CAPS: 500.0000 mg | ORAL_CAPSULE | Freq: Three times a day (TID) | ORAL | 0 refills | Status: DC

## 2021-06-06 NOTE — Patient Instructions (Signed)

## 2021-06-09 NOTE — Progress Notes (Signed)
Subjective:   Patient ID: Lauren Jones, female   DOB: 54 y.o.   MRN: [id number]   HPI 55 year old female presents the office today for concerns of ingrown toenail to left big toe, medial and lateral borders.  This been ongoing for last years and comes and goes but seems to be getting worse and hurts with shoes.  Does get red on the corners.  She tried Epson salt soaks.  No other concerns today.  Review of Systems  All other systems reviewed and are negative.  Past Medical History:  Diagnosis Date   Hypothyroidism     Past Surgical History:  Procedure Laterality Date   THYROIDECTOMY Bilateral 2014-2015?     Current Outpatient Medications:    cephALEXin (KEFLEX) 500 MG capsule, Take 1 capsule (500 mg total) by mouth 3 (three) times daily., Disp: 21 capsule, Rfl: 0   albuterol (PROVENTIL HFA;VENTOLIN HFA) 108 (90 Base) MCG/ACT inhaler, Inhale 2 puffs into the lungs every 6 (six) hours as needed for wheezing or shortness of breath. (Patient not taking: Reported on 11/29/2020), Disp: 1 Inhaler, Rfl: 2   benzonatate (TESSALON) 100 MG capsule, Take 1 capsule (100 mg total) by mouth 3 (three) times daily as needed for cough., Disp: 30 capsule, Rfl: 0   benzonatate (TESSALON) 100 MG capsule, Take 1 capsule (100 mg total) by mouth 3 (three) times daily as needed for cough., Disp: 30 capsule, Rfl: 0   Cholecalciferol (VITAMIN D3) 1000 units CAPS, Take 1 capsule (1,000 Units total) by mouth daily. (Patient not taking: Reported on 11/29/2020), Disp: 90 capsule, Rfl: 1   cyclobenzaprine (FLEXERIL) 10 MG tablet, Take 1 tablet (10 mg total) by mouth daily., Disp: 30 tablet, Rfl: 1   escitalopram (LEXAPRO) 10 MG tablet, Take 1 tablet (10 mg total) by mouth daily. (Patient not taking: Reported on 11/29/2020), Disp: 30 tablet, Rfl: 2   fluticasone (FLONASE) 50 MCG/ACT nasal spray, Place 2 sprays into both nostrils daily., Disp: 16 g, Rfl: 6   hydrochlorothiazide (HYDRODIURIL) 25 MG tablet, Take 1 tablet (25 mg  total) by mouth daily., Disp: 90 tablet, Rfl: 1   levothyroxine (SYNTHROID) 200 MCG tablet, Take 1 tablet (200 mcg total) by mouth daily. Brand name only., Disp: 30 tablet, Rfl: 2   LORazepam (ATIVAN) 0.5 MG tablet, Take 1 tablet (0.5 mg total) by mouth every 8 (eight) hours as needed for anxiety., Disp: 30 tablet, Rfl: 0   megestrol (MEGACE) 40 MG tablet, Take 1 tablets daily, Disp: 90 tablet, Rfl: 5   ORACEA 40 MG capsule, Take 40 mg by mouth daily., Disp: , Rfl:   Current Facility-Administered Medications:    cyanocobalamin ((VITAMIN B-12)) injection 1,000 mcg, 1,000 mcg, Intramuscular, Q30 Wahlert, Zola Button, Yvonne R, DO, 1,000 mcg at 11/29/20 1256  Allergies  Allergen Reactions   Bacitracin Itching   Furosemide Other (See Comments)    Headache   Latex Itching          Objective:  Physical Exam  General: AAO x3, NAD  Dermatological: Incurvation present to the medial aspect of the left hallux toenail with tenderness palpation mostly medial aspect.  Localized edema and erythema but there is no drainage or pus.  No ascending cellulitis.  Vascular: Dorsalis Pedis artery and Posterior Tibial artery pedal pulses are 2/4 bilateral with immedate capillary fill time.  There is no pain with calf compression, swelling, warmth, erythema.   Neruologic: Grossly intact via light touch bilateral.   Musculoskeletal: Tenderness on palpation on the ingrown toenail but  no other areas of discomfort.  Muscular strength 5/5 in all groups tested bilateral.  Gait: Unassisted, Nonantalgic.       Assessment:   Ingrown toenail left hallux     Plan:  -Treatment options discussed including all alternatives, risks, and complications -Etiology of symptoms were discussed -At this time, the patient is requesting partial nail removal with chemical matricectomy to the symptomatic portion of the nail. Risks and complications were discussed with the patient for which they understand and written consent  was obtained. Under sterile conditions a total of 3 mL of a mixture of 2% lidocaine plain and 0.5% Marcaine plain was infiltrated in a hallux block fashion. Once anesthetized, the skin was prepped in sterile fashion. A tourniquet was then applied. Next the medial and latearl aspect of hallux nail border was then sharply excised making sure to remove the entire offending nail border. Once the nails were ensured to be removed area was debrided and the underlying skin was intact. There is no purulence identified in the procedure. Next phenol was then applied under standard conditions and copiously irrigated. Silvadene was applied. A dry sterile dressing was applied. After application of the dressing the tourniquet was removed and there is found to be an immediate capillary refill time to the digit. The patient tolerated the procedure well any complications. Post procedure instructions were discussed the patient for which he verbally understood. Follow-up in one week for nail check or sooner if any problems are to arise. Discussed signs/symptoms of infection and directed to call the office immediately should any occur or go directly to the emergency room. In the meantime, encouraged to call the office with any questions, concerns, changes symptoms. -Keflex  Vivi Barrack DPM

## 2021-06-22 ENCOUNTER — Ambulatory Visit: Payer: BC Managed Care – PPO | Admitting: Podiatry

## 2021-07-11 ENCOUNTER — Other Ambulatory Visit: Payer: Self-pay

## 2021-07-11 ENCOUNTER — Ambulatory Visit (INDEPENDENT_AMBULATORY_CARE_PROVIDER_SITE_OTHER): Payer: BC Managed Care – PPO | Admitting: Podiatry

## 2021-07-11 DIAGNOSIS — L600 Ingrowing nail: Secondary | ICD-10-CM

## 2021-07-15 NOTE — Progress Notes (Signed)
Subjective: 56 year old female presents the office today for follow-up evaluation after undergoing partial nail avulsion to the left big toe, medial, lateral nail borders.  She states that she is doing much better.  She is some occasional discomfort but she denies any swelling redness or any drainage.  No pain currently.  Objective: AAO x3, NAD DP/PT pulses palpable bilaterally, CRT less than 3 seconds Status post partial nail avulsion of the left hallux toenail.  Small mount of scabbing still present but there is no drainage or pus or any swelling or redness.  No signs of infection.  No tenderness today. No pain with calf compression, swelling, warmth, erythema  Assessment: 56 year old female status post partial nail avulsion, healing well  Plan: -All treatment options discussed with the patient including all alternatives, risks, complications.  -Recommends to wash with soap and water daily and dry thoroughly.  I would keep a small amount of antibiotic ointment and a bandage on during the day but can leave the area open at nighttime. -Monitor for any clinical signs or symptoms of infection and directed to call the office immediately should any occur or go to the ER. -Patient encouraged to call the office with any questions, concerns, change in symptoms.   Vivi Barrack DPM

## 2021-07-31 ENCOUNTER — Telehealth: Payer: Self-pay | Admitting: Family Medicine

## 2021-07-31 DIAGNOSIS — E538 Deficiency of other specified B group vitamins: Secondary | ICD-10-CM

## 2021-07-31 NOTE — Telephone Encounter (Signed)
Pt would like to get her b12 labs drawn to check if she needs to keep getting her b12 shots. She would like a call back so she can schedule her lab appointment once the order is in. Please advice.

## 2021-07-31 NOTE — Telephone Encounter (Signed)
Orders placed. Please have patient schedule a lab appt.

## 2021-08-02 ENCOUNTER — Other Ambulatory Visit (INDEPENDENT_AMBULATORY_CARE_PROVIDER_SITE_OTHER): Payer: BC Managed Care – PPO

## 2021-08-02 DIAGNOSIS — E538 Deficiency of other specified B group vitamins: Secondary | ICD-10-CM

## 2021-08-02 DIAGNOSIS — E039 Hypothyroidism, unspecified: Secondary | ICD-10-CM | POA: Diagnosis not present

## 2021-08-02 LAB — VITAMIN B12: Vitamin B-12: 249 pg/mL (ref 211–911)

## 2021-08-02 LAB — TSH: TSH: 0.92 u[IU]/mL (ref 0.35–5.50)

## 2021-08-08 ENCOUNTER — Other Ambulatory Visit: Payer: Self-pay

## 2021-08-08 ENCOUNTER — Telehealth: Payer: Self-pay

## 2021-08-08 MED ORDER — LEVOTHYROXINE SODIUM 200 MCG PO TABS: 200.0000 ug | ORAL_TABLET | Freq: Every day | ORAL | 2 refills | Status: AC

## 2021-08-08 NOTE — Telephone Encounter (Signed)
Never mind, I thought this message was cancelled, I gave patient a follow up appointment.

## 2021-08-15 ENCOUNTER — Encounter: Payer: Self-pay | Admitting: Family Medicine

## 2021-08-15 ENCOUNTER — Ambulatory Visit (INDEPENDENT_AMBULATORY_CARE_PROVIDER_SITE_OTHER): Payer: BC Managed Care – PPO | Admitting: Family Medicine

## 2021-08-15 VITALS — BP 115/81 | HR 76 | Temp 97.8°F | Resp 12 | Wt 229.8 lb

## 2021-08-15 DIAGNOSIS — R5383 Other fatigue: Secondary | ICD-10-CM

## 2021-08-15 DIAGNOSIS — M62838 Other muscle spasm: Secondary | ICD-10-CM

## 2021-08-15 DIAGNOSIS — E538 Deficiency of other specified B group vitamins: Secondary | ICD-10-CM | POA: Diagnosis not present

## 2021-08-15 DIAGNOSIS — D649 Anemia, unspecified: Secondary | ICD-10-CM | POA: Diagnosis not present

## 2021-08-15 DIAGNOSIS — I10 Essential (primary) hypertension: Secondary | ICD-10-CM

## 2021-08-15 MED ORDER — CYCLOBENZAPRINE HCL 10 MG PO TABS: 10.0000 mg | ORAL_TABLET | Freq: Every day | ORAL | 1 refills | Status: DC

## 2021-08-15 MED ORDER — CYANOCOBALAMIN 1000 MCG/ML IJ SOLN
1000.0000 ug | Freq: Once | INTRAMUSCULAR | Status: AC
  Administered 2021-08-15: 1000 ug via INTRAMUSCULAR

## 2021-08-15 NOTE — Assessment & Plan Note (Signed)
Check labs b12 today 

## 2021-08-15 NOTE — Patient Instructions (Signed)
Vitamin B12 Deficiency ?Vitamin B12 deficiency occurs when the body does not have enough vitamin B12, which is an important vitamin. The body needs this vitamin: ?To make red blood cells. ?To make DNA. This is the genetic material inside cells. ?To help the nerves work properly so they can carry messages from the brain to the body. ?Vitamin B12 deficiency can cause various health problems, such as a low red blood cell count (anemia) or nerve damage. ?What are the causes? ?This condition may be caused by: ?Not eating enough foods that contain vitamin B12. ?Not having enough stomach acid and digestive fluids to properly absorb vitamin B12 from the food that you eat. ?Certain digestive system diseases that make it hard to absorb vitamin B12. These diseases include Crohn's disease, chronic pancreatitis, and cystic fibrosis. ?A condition in which the body does not make enough of a protein (intrinsic factor), resulting in too few red blood cells (pernicious anemia). ?Having a surgery in which part of the stomach or small intestine is removed. ?Taking certain medicines that make it hard for the body to absorb vitamin B12. These medicines include: ?Heartburn medicines (antacids and proton pump inhibitors). ?Certain antibiotic medicines. ?Some medicines that are used to treat diabetes, tuberculosis, gout, or high cholesterol. ?What increases the risk? ?The following factors may make you more likely to develop a B12 deficiency: ?Being older than age 50. ?Eating a vegetarian or vegan diet, especially while you are pregnant. ?Eating a poor diet while you are pregnant. ?Taking certain medicines. ?Having alcoholism. ?What are the signs or symptoms? ?In some cases, there are no symptoms of this condition. If the condition leads to anemia or nerve damage, various symptoms can occur, such as: ?Weakness. ?Fatigue. ?Loss of appetite. ?Weight loss. ?Numbness or tingling in your hands and feet. ?Redness and burning of the  tongue. ?Confusion or memory problems. ?Depression. ?Sensory problems, such as color blindness, ringing in the ears, or loss of taste. ?Diarrhea or constipation. ?Trouble walking. ?If anemia is severe, symptoms can include: ?Shortness of breath. ?Dizziness. ?Rapid heart rate (tachycardia). ?How is this diagnosed? ?This condition may be diagnosed with a blood test to measure the level of vitamin B12 in your blood. You may also have other tests, including: ?A group of tests that measure certain characteristics of blood cells (complete blood count, CBC). ?A blood test to measure intrinsic factor. ?A procedure where a thin tube with a camera on the end is used to look into your stomach or intestines (endoscopy). ?Other tests may be needed to discover the cause of B12 deficiency. ?How is this treated? ?Treatment for this condition depends on the cause. This condition may be treated by: ?Changing your eating and drinking habits, such as: ?Eating more foods that contain vitamin B12. ?Drinking less alcohol or no alcohol. ?Getting vitamin B12 injections. ?Taking vitamin B12 supplements. Your health care provider will tell you which dosage is best for you. ?Follow these instructions at home: ?Eating and drinking ? ?Eat lots of healthy foods that contain vitamin B12, including: ?Meats and poultry. This includes beef, pork, chicken, turkey, and organ meats, such as liver. ?Seafood. This includes clams, rainbow trout, salmon, tuna, and haddock. ?Eggs. ?Cereal and dairy products that are fortified. This means that vitamin B12 has been added to the food. Check the label on the package to see if the food is fortified. ?The items listed above may not be a complete list of recommended foods and beverages. Contact a dietitian for more information. ?General instructions ?Get any   injections that are prescribed by your health care provider. ?Take supplements only as told by your health care provider. Follow the directions carefully. ?Do  not drink alcohol if your health care provider tells you not to. In some cases, you may only be asked to limit alcohol use. ?Keep all follow-up visits as told by your health care provider. This is important. ?Contact a health care provider if: ?Your symptoms come back. ?Get help right away if you: ?Develop shortness of breath. ?Have a rapid heart rate. ?Have chest pain. ?Become dizzy or lose consciousness. ?Summary ?Vitamin B12 deficiency occurs when the body does not have enough vitamin B12. ?The main causes of vitamin B12 deficiency include dietary deficiency, digestive diseases, pernicious anemia, and having a surgery in which part of the stomach or small intestine is removed. ?In some cases, there are no symptoms of this condition. If the condition leads to anemia or nerve damage, various symptoms can occur, such as weakness, shortness of breath, and numbness. ?Treatment may include getting vitamin B12 injections or taking vitamin B12 supplements. Eat lots of healthy foods that contain vitamin B12. ?This information is not intended to replace advice given to you by your health care provider. Make sure you discuss any questions you have with your health care provider. ?Document Revised: 12/07/2020 Document Reviewed: 02/18/2018 ?Elsevier Patient Education ? 2022 [employer] Inc[employer] ? ?

## 2021-08-15 NOTE — Progress Notes (Signed)
Subjective:   By signing my name below, I, Shehryar Baig, attest that this documentation has been prepared under the direction and in the presence of Donato Schultz, DO  08/15/2021    Patient ID: Lauren Jones, female    DOB: 02/08/1966, 56 y.o.   MRN: [id number]  Chief Complaint  Patient presents with   Follow-up    Still feel fatigued, swelling in ankles, and follow up on blood work    Weight Loss   muscle relaxer refill    Cyclobenzaprine     HPI Patient is in today for a office visit.  She continues complaining of fatigue through out the day. She is sleeping all night but feels tired. She does not know if she snores. She was not told she moves a lot while sleeping. She has never completed a sleep study. She is not interested in completing a sleep study at this time. Her last lab work showed she had low-normal range B12 levels and normal thyroid levels. She is requesting vitamin B12 injections. She has tried injections in the past and reports doing well while taking it.  She also complains of her feet feeling cold constantly. She denies having any numbness in her feet.  She reports having swelling in her ankles. She finds the swelling is down in the morning after she takes her 25 mg hydrochlorothiazide daily but increases throughout the day while standing working. She would like to continue taking 25 mg hydrochlorothiazide and is planning losing weight to manage her swelling.  She is requesting to take weight loss medication. She is participating in regular exercise at this time and reports losing 20 lb's since she started.  Wt Readings from Last 3 Encounters:  08/15/21 229 lb 12.8 oz (104.2 kg)  11/29/20 249 lb (112.9 kg)  09/26/20 242 lb 3.2 oz (109.9 kg)    Past Medical History:  Diagnosis Date   Hypothyroidism     Past Surgical History:  Procedure Laterality Date   THYROIDECTOMY Bilateral 2014-2015?    Family History  Problem Relation Age of Onset   Hypertension  Mother    Hypertension Father    Sarcoidosis Father    Colon cancer Paternal Grandmother     Social History   Socioeconomic History   Marital status: Single    Spouse name: Not on file   Number of children: Not on file   Years of education: Not on file   Highest education level: Not on file  Occupational History   Not on file  Tobacco Use   Smoking status: Former    Types: Cigarettes   Smokeless tobacco: Never  Substance and Sexual Activity   Alcohol use: Yes    Comment: social   Drug use: Never   Sexual activity: Not Currently  Other Topics Concern   Not on file  Social History Narrative   Not on file   Social Determinants of Health   Financial Resource Strain: Not on file  Food Insecurity: Not on file  Transportation Needs: Not on file  Physical Activity: Not on file  Stress: Not on file  Social Connections: Not on file  Intimate Partner Violence: Not on file    Outpatient Medications Prior to Visit  Medication Sig Dispense Refill   cephALEXin (KEFLEX) 500 MG capsule Take 1 capsule (500 mg total) by mouth 3 (three) times daily. 21 capsule 0   fluticasone (FLONASE) 50 MCG/ACT nasal spray Place 2 sprays into both nostrils daily. 16 g 6  hydrochlorothiazide (HYDRODIURIL) 25 MG tablet Take 1 tablet (25 mg total) by mouth daily. 90 tablet 1   levothyroxine (SYNTHROID) 200 MCG tablet Take 1 tablet (200 mcg total) by mouth daily. Brand name only. 30 tablet 2   LORazepam (ATIVAN) 0.5 MG tablet Take 1 tablet (0.5 mg total) by mouth every 8 (eight) hours as needed for anxiety. 30 tablet 0   megestrol (MEGACE) 40 MG tablet Take 1 tablets daily 90 tablet 5   ORACEA 40 MG capsule Take 40 mg by mouth daily.     cyclobenzaprine (FLEXERIL) 10 MG tablet Take 1 tablet (10 mg total) by mouth daily. 30 tablet 1   Cholecalciferol (VITAMIN D3) 1000 units CAPS Take 1 capsule (1,000 Units total) by mouth daily. (Patient not taking: Reported on 11/29/2020) 90 capsule 1   escitalopram  (LEXAPRO) 10 MG tablet Take 1 tablet (10 mg total) by mouth daily. (Patient not taking: Reported on 11/29/2020) 30 tablet 2   albuterol (PROVENTIL HFA;VENTOLIN HFA) 108 (90 Base) MCG/ACT inhaler Inhale 2 puffs into the lungs every 6 (six) hours as needed for wheezing or shortness of breath. (Patient not taking: Reported on 11/29/2020) 1 Inhaler 2   benzonatate (TESSALON) 100 MG capsule Take 1 capsule (100 mg total) by mouth 3 (three) times daily as needed for cough. 30 capsule 0   benzonatate (TESSALON) 100 MG capsule Take 1 capsule (100 mg total) by mouth 3 (three) times daily as needed for cough. 30 capsule 0   cyanocobalamin ((VITAMIN B-12)) injection 1,000 mcg      No facility-administered medications prior to visit.    Allergies  Allergen Reactions   Bacitracin Itching   Furosemide Other (See Comments)    Headache   Latex Itching    Review of Systems  Constitutional:  Positive for malaise/fatigue.  Cardiovascular:  Positive for leg swelling.  Neurological:        (-)numbness in feet  Psychiatric/Behavioral:         (+)feeling tired after a full nights rest      Objective:    Physical Exam Constitutional:      General: She is not in acute distress.    Appearance: Normal appearance. She is not ill-appearing.  HENT:     Head: Normocephalic and atraumatic.     Right Ear: External ear normal.     Left Ear: External ear normal.  Eyes:     Extraocular Movements: Extraocular movements intact.     Pupils: Pupils are equal, round, and reactive to light.  Cardiovascular:     Rate and Rhythm: Normal rate and regular rhythm.     Heart sounds: Normal heart sounds. No murmur heard.   No gallop.  Pulmonary:     Effort: Pulmonary effort is normal. No respiratory distress.     Breath sounds: Normal breath sounds. No wheezing or rales.  Skin:    General: Skin is warm and dry.  Neurological:     Mental Status: She is alert and oriented to person, place, and time.  Psychiatric:         Behavior: Behavior normal.        Judgment: Judgment normal.    BP 115/81 (BP Location: Left Arm, Cuff Size: Large)    Pulse 76    Temp 97.8 F (36.6 C) (Oral)    Resp 12    Wt 229 lb 12.8 oz (104.2 kg)    SpO2 97%    BMI 38.24 kg/m  Wt Readings from Last 3 Encounters:  08/15/21 229 lb 12.8 oz (104.2 kg)  11/29/20 249 lb (112.9 kg)  09/26/20 242 lb 3.2 oz (109.9 kg)    Diabetic Foot Exam - Simple   No data filed    Lab Results  Component Value Date   WBC 10.1 09/26/2020   HGB 11.7 (L) 09/26/2020   HCT 35.3 (L) 09/26/2020   PLT 315.0 09/26/2020   GLUCOSE 90 09/26/2020   CHOL 188 12/22/2018   TRIG 137.0 12/22/2018   HDL 50.50 12/22/2018   LDLCALC 110 (H) 12/22/2018   ALT 17 09/26/2020   AST 16 09/26/2020   NA 139 09/26/2020   K 3.6 09/26/2020   CL 104 09/26/2020   CREATININE 1.06 09/26/2020   BUN 8 09/26/2020   CO2 28 09/26/2020   TSH 0.92 08/02/2021   HGBA1C 5.8 12/22/2018    Lab Results  Component Value Date   TSH 0.92 08/02/2021   Lab Results  Component Value Date   WBC 10.1 09/26/2020   HGB 11.7 (L) 09/26/2020   HCT 35.3 (L) 09/26/2020   MCV 79.3 09/26/2020   PLT 315.0 09/26/2020   Lab Results  Component Value Date   NA 139 09/26/2020   K 3.6 09/26/2020   CO2 28 09/26/2020   GLUCOSE 90 09/26/2020   BUN 8 09/26/2020   CREATININE 1.06 09/26/2020   BILITOT 0.5 09/26/2020   ALKPHOS 75 09/26/2020   AST 16 09/26/2020   ALT 17 09/26/2020   PROT 6.7 09/26/2020   ALBUMIN 4.2 09/26/2020   CALCIUM 9.3 09/26/2020   GFR 59.90 (L) 09/26/2020   Lab Results  Component Value Date   CHOL 188 12/22/2018   Lab Results  Component Value Date   HDL 50.50 12/22/2018   Lab Results  Component Value Date   LDLCALC 110 (H) 12/22/2018   Lab Results  Component Value Date   TRIG 137.0 12/22/2018   Lab Results  Component Value Date   CHOLHDL 4 12/22/2018   Lab Results  Component Value Date   HGBA1C 5.8 12/22/2018       Assessment & Plan:   Problem  List Items Addressed This Visit       Unprioritized   B12 deficiency - Primary    Injection today F/u 1 month      Essential hypertension    Well controlled, no changes to meds. Encouraged heart healthy diet such as the DASH diet and exercise as tolerated.       Other fatigue    Check labs  b12 today      Other Visit Diagnoses     Muscle spasm       Relevant Medications   cyclobenzaprine (FLEXERIL) 10 MG tablet   Anemia, unspecified type       Relevant Medications   cyanocobalamin ((VITAMIN B-12)) injection 1,000 mcg (Completed)        Meds ordered this encounter  Medications   cyclobenzaprine (FLEXERIL) 10 MG tablet    Sig: Take 1 tablet (10 mg total) by mouth daily.    Dispense:  30 tablet    Refill:  1   cyanocobalamin ((VITAMIN B-12)) injection 1,000 mcg    I, Lowne Chase, [employer], DO, personally preformed the services described in this documentation.  All medical record entries made by the scribe were at my direction and in my presence.  I have reviewed the chart and discharge instructions (if applicable) and agree that the record reflects my personal performance and is accurate and complete. 08/15/2021   I,Shehryar  Baig,acting as a Education administrator for [employer], DO.,have documented all relevant documentation on the behalf of Ann Held, DO,as directed by  Ann Held, DO while in the presence of Ann Held, DO.   Ann Held, DO

## 2021-08-15 NOTE — Assessment & Plan Note (Signed)
Injection today F/u 1 month

## 2021-08-15 NOTE — Assessment & Plan Note (Signed)
Well controlled, no changes to meds. Encouraged heart healthy diet such as the DASH diet and exercise as tolerated.  °

## 2021-08-30 IMAGING — MG DIGITAL SCREENING BILAT W/ TOMO W/ CAD
8 series · 8 of 24 positions shown · non-contrast
Comparison: Previous exam(s).

CLINICAL DATA: Screening.

EXAM:
DIGITAL SCREENING BILATERAL MAMMOGRAM WITH TOMO AND CAD

[L MLO synth-2D]
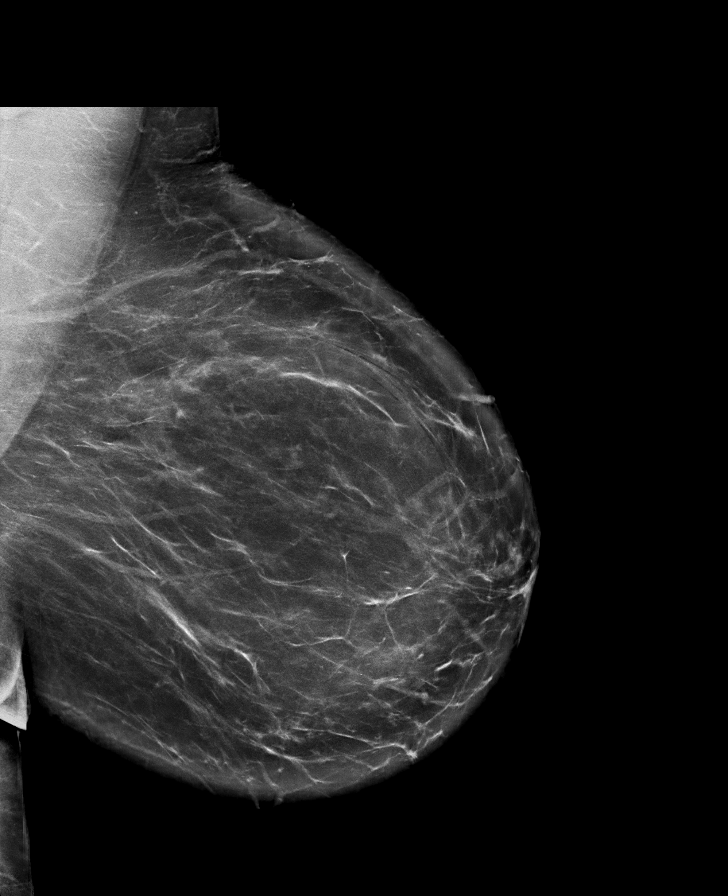

[R CC synth-2D]
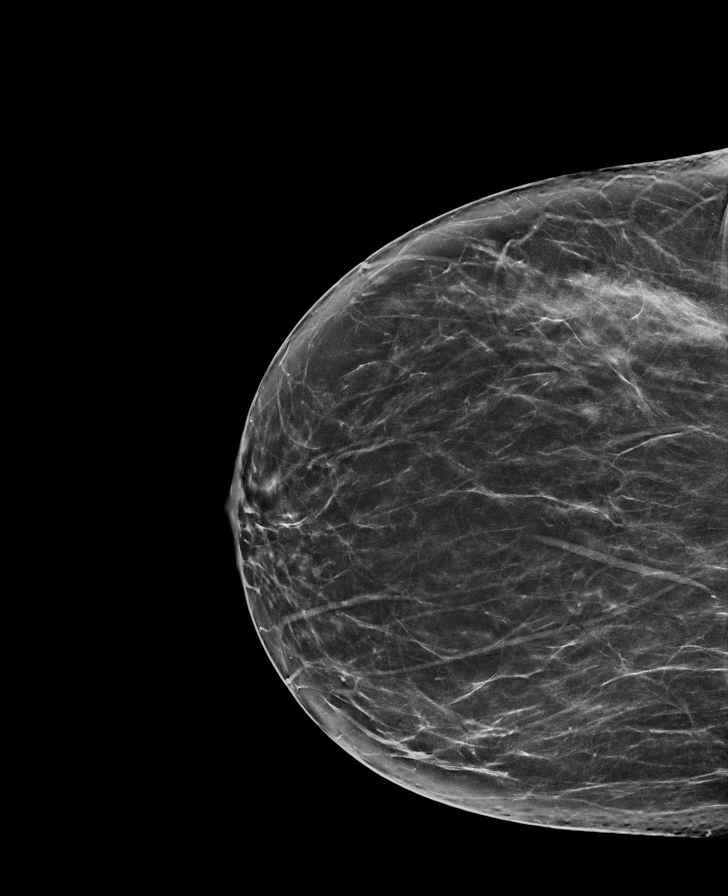

[R MLO synth-2D]
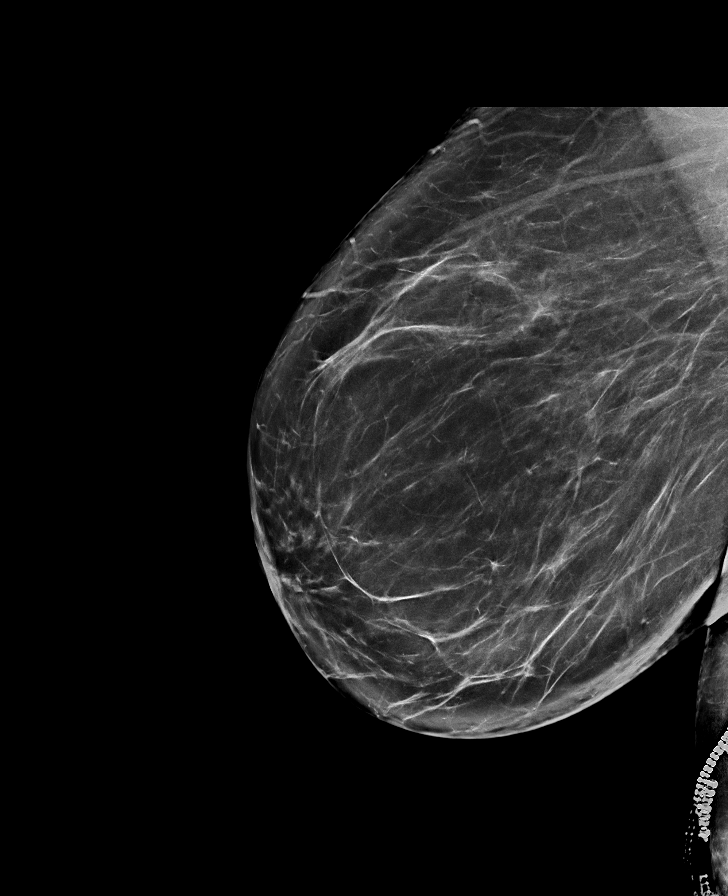

[L CC synth-2D]
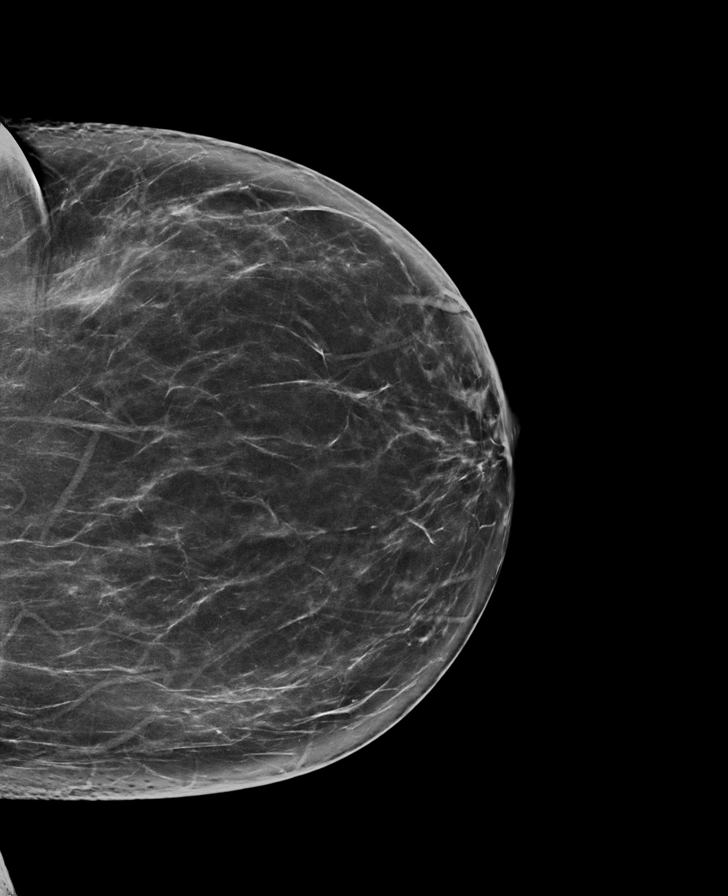

[L MLO tomo · tomo slice 51/100.0]
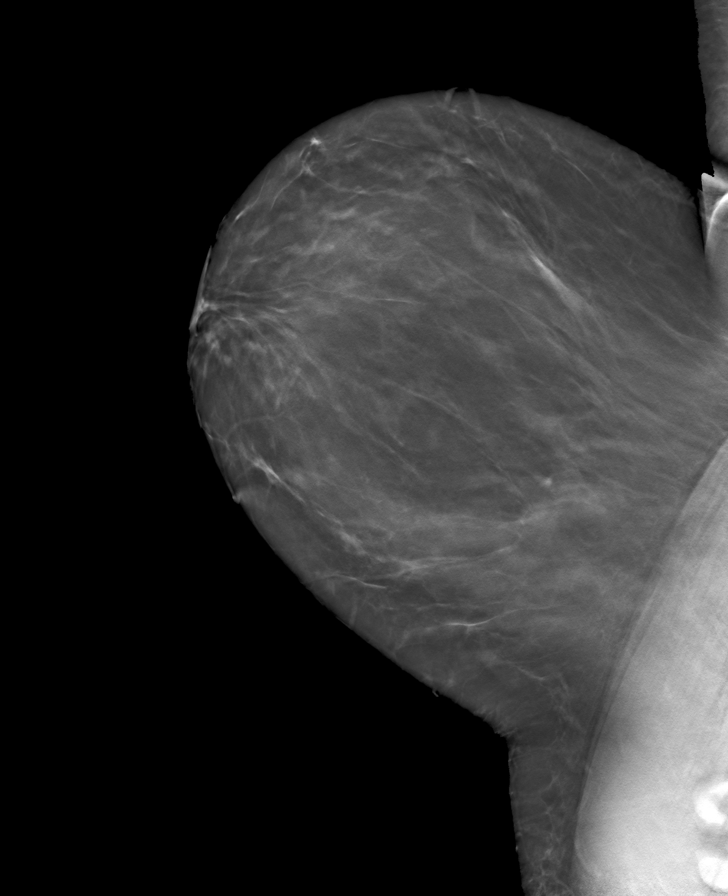

[R CC tomo · tomo slice 37/74.0]
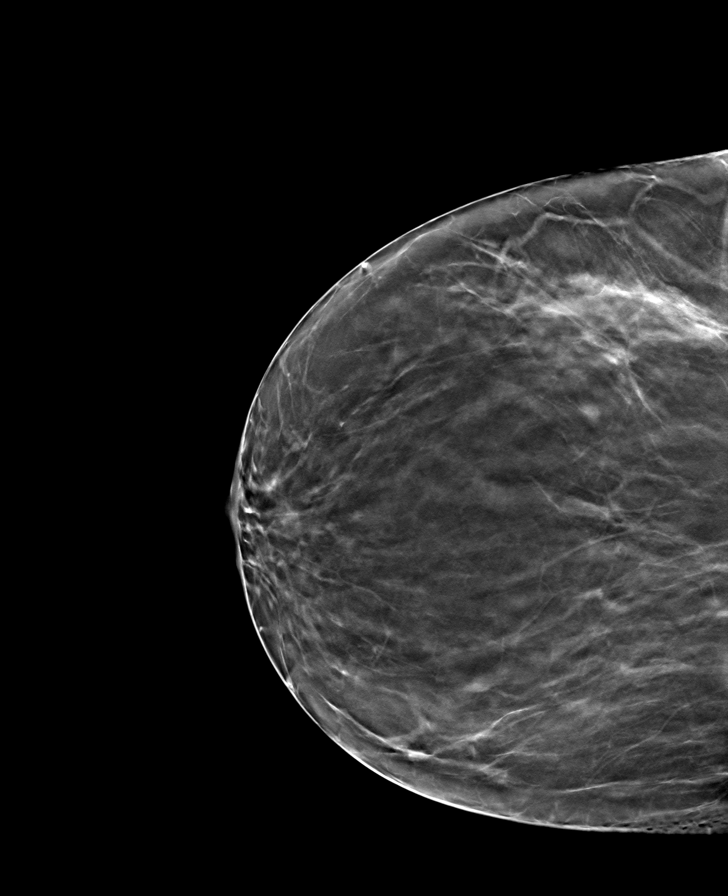

[L CC tomo · tomo slice 42/83.0]
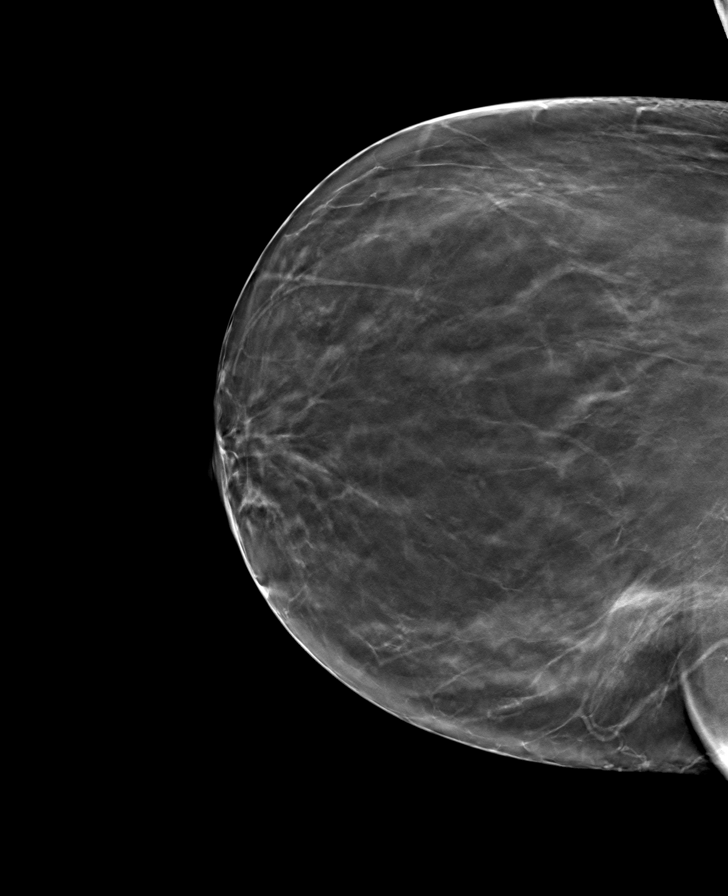

[R MLO tomo · tomo slice 45/88.0]
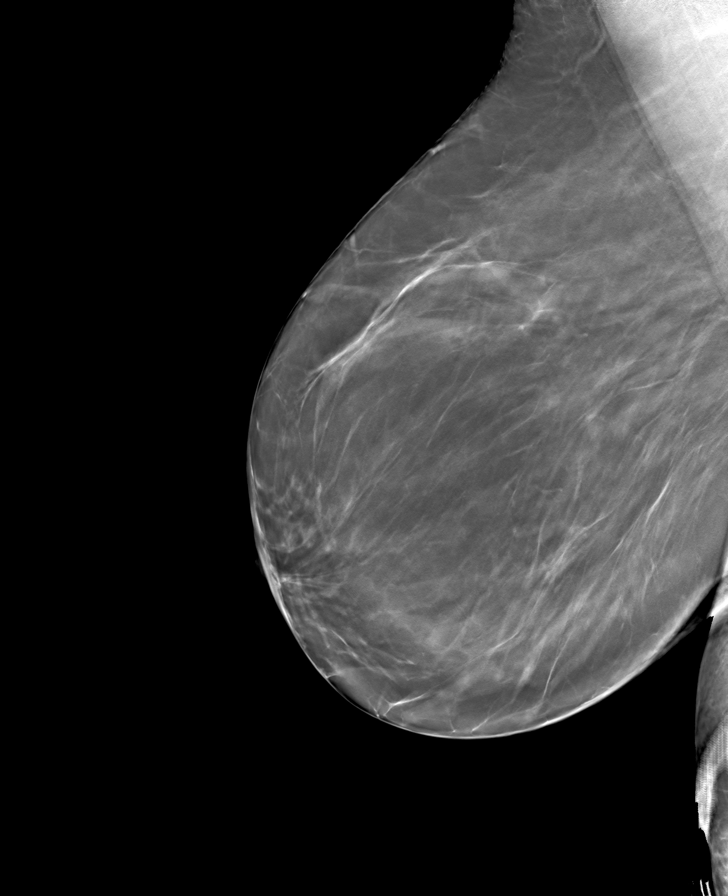

[8 of 24 positions shown; findings below may reference images not displayed]

ACR Breast Density Category b: There are scattered areas of
fibroglandular density.
FINDINGS: In the right breast, a possible asymmetry warrants further
evaluation. In the left breast, no findings suspicious for
malignancy. Images were processed with CAD.
IMPRESSION: Further evaluation is suggested for possible asymmetry in the right
breast.

RECOMMENDATION:
Diagnostic mammogram and possibly ultrasound of the right breast.
(Code:[id number]-55T)

The patient will be contacted regarding the findings, and additional
imaging will be scheduled.

BI-RADS CATEGORY  0: Incomplete. Need additional imaging evaluation
and/or prior mammograms for comparison.

## 2021-09-12 ENCOUNTER — Ambulatory Visit (INDEPENDENT_AMBULATORY_CARE_PROVIDER_SITE_OTHER): Payer: BC Managed Care – PPO | Admitting: Family Medicine

## 2021-09-12 VITALS — BP 136/88 | HR 50 | Temp 97.8°F | Resp 12 | Ht 65.0 in | Wt 232.4 lb

## 2021-09-12 DIAGNOSIS — R609 Edema, unspecified: Secondary | ICD-10-CM | POA: Diagnosis not present

## 2021-09-12 DIAGNOSIS — M79605 Pain in left leg: Secondary | ICD-10-CM

## 2021-09-12 DIAGNOSIS — E538 Deficiency of other specified B group vitamins: Secondary | ICD-10-CM

## 2021-09-12 MED ORDER — SPIRONOLACTONE 25 MG PO TABS: 25.0000 mg | ORAL_TABLET | Freq: Every day | ORAL | 3 refills | Status: DC

## 2021-09-12 MED ORDER — CYANOCOBALAMIN 1000 MCG/ML IJ SOLN
1000.0000 ug | Freq: Once | INTRAMUSCULAR | Status: AC
  Administered 2021-09-12: 1000 ug via INTRAMUSCULAR

## 2021-09-12 NOTE — Progress Notes (Signed)
? ?Subjective:  ? ?By signing my name below, I, Shehryar Baig, attest that this documentation has been prepared under the direction and in the presence of Dr. Seabron Spates, DO. 09/12/2021 ? ? ? Patient ID: Lauren Jones, female    DOB: 04-07-1966, 56 y.o.   MRN: [id number] ? ?Chief Complaint  ?Patient presents with  ? Follow-up  ?  Would like new water pill?  ? ? ?HPI ?Patient is in today for a office visit.  ? ?She continues having swelling in her ankles. She finds while taking 25 mg hydrochlorothiazide her swelling is on and off. She also feels a cold in her left ankle. She also notes when her swelling worsens she feels like she is "walking in snow" while moving. She reports feeling like her ankle was cold since her thyroid was removed. Her blood pressure is elevated during this visit. She reports having increased stress due to family events.  ?BP Readings from Last 3 Encounters:  ?09/12/21 136/88  ?08/15/21 115/81  ?11/29/20 120/88  ? ?Pulse Readings from Last 3 Encounters:  ?09/12/21 (!) 50  ?08/15/21 76  ?11/29/20 62  ? ? ?Past Medical History:  ?Diagnosis Date  ? Hypothyroidism   ? ? ?Past Surgical History:  ?Procedure Laterality Date  ? THYROIDECTOMY Bilateral 2014-2015?  ? ? ?Family History  ?Problem Relation Age of Onset  ? Hypertension Mother   ? Hypertension Father   ? Sarcoidosis Father   ? Colon cancer Paternal Grandmother   ? ? ?Social History  ? ?Socioeconomic History  ? Marital status: Single  ?  Spouse name: Not on file  ? Number of children: Not on file  ? Years of education: Not on file  ? Highest education level: Not on file  ?Occupational History  ? Not on file  ?Tobacco Use  ? Smoking status: Former  ?  Types: Cigarettes  ? Smokeless tobacco: Never  ?Substance and Sexual Activity  ? Alcohol use: Yes  ?  Comment: social  ? Drug use: Never  ? Sexual activity: Not Currently  ?Other Topics Concern  ? Not on file  ?Social History Narrative  ? Not on file  ? ?Social Determinants of Health   ? ?Financial Resource Strain: Not on file  ?Food Insecurity: Not on file  ?Transportation Needs: Not on file  ?Physical Activity: Not on file  ?Stress: Not on file  ?Social Connections: Not on file  ?Intimate Partner Violence: Not on file  ? ? ?Outpatient Medications Prior to Visit  ?Medication Sig Dispense Refill  ? cyclobenzaprine (FLEXERIL) 10 MG tablet Take 1 tablet (10 mg total) by mouth daily. 30 tablet 1  ? fluticasone (FLONASE) 50 MCG/ACT nasal spray Place 2 sprays into both nostrils daily. 16 g 6  ? levothyroxine (SYNTHROID) 200 MCG tablet Take 1 tablet (200 mcg total) by mouth daily. Brand name only. 30 tablet 2  ? hydrochlorothiazide (HYDRODIURIL) 25 MG tablet Take 1 tablet (25 mg total) by mouth daily. 90 tablet 1  ? Cholecalciferol (VITAMIN D3) 1000 units CAPS Take 1 capsule (1,000 Units total) by mouth daily. (Patient not taking: Reported on 11/29/2020) 90 capsule 1  ? cephALEXin (KEFLEX) 500 MG capsule Take 1 capsule (500 mg total) by mouth 3 (three) times daily. 21 capsule 0  ? escitalopram (LEXAPRO) 10 MG tablet Take 1 tablet (10 mg total) by mouth daily. (Patient not taking: Reported on 11/29/2020) 30 tablet 2  ? LORazepam (ATIVAN) 0.5 MG tablet Take 1 tablet (0.5 mg total) by mouth  every 8 (eight) hours as needed for anxiety. 30 tablet 0  ? megestrol (MEGACE) 40 MG tablet Take 1 tablets daily 90 tablet 5  ? ORACEA 40 MG capsule Take 40 mg by mouth daily.    ? ?No facility-administered medications prior to visit.  ? ? ?Allergies  ?Allergen Reactions  ? Bacitracin Itching  ? Furosemide Other (See Comments)  ?  Headache  ? Latex Itching  ? ? ?Review of Systems  ?Constitutional:  Negative for fever and malaise/fatigue.  ?HENT:  Negative for congestion.   ?Eyes:  Negative for blurred vision.  ?Respiratory:  Negative for cough and shortness of breath.   ?Cardiovascular:  Positive for leg swelling. Negative for chest pain and palpitations.  ?Gastrointestinal:  Negative for vomiting.  ?Musculoskeletal:   Negative for back pain.  ?Skin:  Negative for rash.  ?Neurological:  Negative for loss of consciousness and headaches.  ? ?   ?Objective:  ?  ?Physical Exam ?Vitals and nursing note reviewed.  ?Constitutional:   ?   General: She is not in acute distress. ?   Appearance: Normal appearance. She is not ill-appearing.  ?HENT:  ?   Head: Normocephalic and atraumatic.  ?   Right Ear: External ear normal.  ?   Left Ear: External ear normal.  ?Eyes:  ?   Extraocular Movements: Extraocular movements intact.  ?   Pupils: Pupils are equal, round, and reactive to light.  ?Cardiovascular:  ?   Rate and Rhythm: Normal rate and regular rhythm.  ?   Heart sounds: Normal heart sounds. No murmur heard. ?  No gallop.  ?Pulmonary:  ?   Effort: Pulmonary effort is normal. No respiratory distress.  ?   Breath sounds: Normal breath sounds. No wheezing or rales.  ?Musculoskeletal:  ?   Right lower leg: 1+ Edema present.  ?   Left lower leg: 1+ Edema present.  ?Skin: ?   General: Skin is warm and dry.  ?Neurological:  ?   Mental Status: She is alert and oriented to person, place, and time.  ?Psychiatric:     ?   Judgment: Judgment normal.  ? ? ?BP 136/88   Pulse (!) 50   Temp 97.8 ?F (36.6 ?C) (Oral)   Resp 12   Ht 5\' 5"  (1.651 m)   Wt 232 lb 6.4 oz (105.4 kg)   SpO2 97%   BMI 38.67 kg/m?  ?Wt Readings from Last 3 Encounters:  ?09/12/21 232 lb 6.4 oz (105.4 kg)  ?08/15/21 229 lb 12.8 oz (104.2 kg)  ?11/29/20 249 lb (112.9 kg)  ? ? ?Diabetic Foot Exam - Simple   ?No data filed ?  ? ?Lab Results  ?Component Value Date  ? WBC 10.1 09/26/2020  ? HGB 11.7 (L) 09/26/2020  ? HCT 35.3 (L) 09/26/2020  ? PLT 315.0 09/26/2020  ? GLUCOSE 90 09/26/2020  ? CHOL 188 12/22/2018  ? TRIG 137.0 12/22/2018  ? HDL 50.50 12/22/2018  ? LDLCALC 110 (H) 12/22/2018  ? ALT 17 09/26/2020  ? AST 16 09/26/2020  ? NA 139 09/26/2020  ? K 3.6 09/26/2020  ? CL 104 09/26/2020  ? CREATININE 1.06 09/26/2020  ? BUN 8 09/26/2020  ? CO2 28 09/26/2020  ? TSH 0.92 08/02/2021   ? HGBA1C 5.8 12/22/2018  ? ? ?Lab Results  ?Component Value Date  ? TSH 0.92 08/02/2021  ? ?Lab Results  ?Component Value Date  ? WBC 10.1 09/26/2020  ? HGB 11.7 (L) 09/26/2020  ? HCT 35.3 (  L) 09/26/2020  ? MCV 79.3 09/26/2020  ? PLT 315.0 09/26/2020  ? ?Lab Results  ?Component Value Date  ? NA 139 09/26/2020  ? K 3.6 09/26/2020  ? CO2 28 09/26/2020  ? GLUCOSE 90 09/26/2020  ? BUN 8 09/26/2020  ? CREATININE 1.06 09/26/2020  ? BILITOT 0.5 09/26/2020  ? ALKPHOS 75 09/26/2020  ? AST 16 09/26/2020  ? ALT 17 09/26/2020  ? PROT 6.7 09/26/2020  ? ALBUMIN 4.2 09/26/2020  ? CALCIUM 9.3 09/26/2020  ? GFR 59.90 (L) 09/26/2020  ? ?Lab Results  ?Component Value Date  ? CHOL 188 12/22/2018  ? ?Lab Results  ?Component Value Date  ? HDL 50.50 12/22/2018  ? ?Lab Results  ?Component Value Date  ? LDLCALC 110 (H) 12/22/2018  ? ?Lab Results  ?Component Value Date  ? TRIG 137.0 12/22/2018  ? ?Lab Results  ?Component Value Date  ? CHOLHDL 4 12/22/2018  ? ?Lab Results  ?Component Value Date  ? HGBA1C 5.8 12/22/2018  ? ? ?   ?Assessment & Plan:  ? ?Problem List Items Addressed This Visit   ? ?  ? Unprioritized  ? B12 deficiency  ? ?Other Visit Diagnoses   ? ? Left leg pain    -  Primary  ? Relevant Orders  ? VAS [place] ABI WITH/WO TBI  ? Edema, unspecified type      ? Relevant Medications  ? spironolactone (ALDACTONE) 25 MG tablet  ? ?  ? ? ? ?Meds ordered this encounter  ?Medications  ? spironolactone (ALDACTONE) 25 MG tablet  ?  Sig: Take 1 tablet (25 mg total) by mouth daily.  ?  Dispense:  90 tablet  ?  Refill:  3  ? cyanocobalamin ((VITAMIN B-12)) injection 1,000 mcg  ? ? ?I, Donato Schultz, DO, personally preformed the services described in this documentation.  All medical record entries made by the scribe were at my direction and in my presence.  I have reviewed the chart and discharge instructions (if applicable) and agree that the record reflects my personal performance and is accurate and complete. 09/12/2021 ? ? ?Systems developer as a Neurosurgeon for [employer], DO.,have documented all relevant documentation on the behalf of Donato Schultz, DO,as directed by  Donato Schultz, DO while in the presence of Esmeralda Arthur

## 2021-09-14 ENCOUNTER — Encounter: Payer: Self-pay | Admitting: Family Medicine

## 2021-09-19 ENCOUNTER — Telehealth: Payer: Self-pay | Admitting: Family Medicine

## 2021-09-19 NOTE — Telephone Encounter (Signed)
Pt states she would like to stay on current bp medication until after her surgery, before starting a new one. She does not know the name of current medication or what else was recommended. She would like a call from the nurse to discuss this. Please advise.  ?

## 2021-09-21 NOTE — Telephone Encounter (Signed)
Patient stated that she would like to stay on hctz 25mg  instead of starting new medication spioronlactone.  She stated that she is afraid her hair may come out and she would like to wait until her doppler is done.  She also notices that when she takes the hctz first thing in the morning that she does not have any swelling.  Are you ok if she stays on the hctz until after surgery? ?

## 2021-09-25 NOTE — Telephone Encounter (Signed)
Left detailed message on machine that it is ok for patient to stay on medication. ?

## 2021-10-02 ENCOUNTER — Ambulatory Visit (HOSPITAL_COMMUNITY)
Admission: RE | Admit: 2021-10-02 | Discharge: 2021-10-02 | Disposition: A | Discharge status: Home / Self Care | Payer: BC Managed Care – PPO | Source: Ambulatory Visit | Attending: Family Medicine | Admitting: Family Medicine

## 2021-10-02 DIAGNOSIS — M79605 Pain in left leg: Secondary | ICD-10-CM | POA: Diagnosis not present

## 2021-10-02 NOTE — Progress Notes (Signed)
ABI completed. ?Refer to "CV Proc" under chart review to view preliminary results. ? ?10/02/2021 10:26 AM ?Eula Fried., MHA, RVT, RDCS, RDMS   ?

## 2021-10-17 ENCOUNTER — Ambulatory Visit: Payer: BC Managed Care – PPO

## 2021-10-20 ENCOUNTER — Ambulatory Visit (INDEPENDENT_AMBULATORY_CARE_PROVIDER_SITE_OTHER): Payer: BC Managed Care – PPO

## 2021-10-20 ENCOUNTER — Ambulatory Visit: Payer: BC Managed Care – PPO

## 2021-10-20 DIAGNOSIS — E538 Deficiency of other specified B group vitamins: Secondary | ICD-10-CM | POA: Diagnosis not present

## 2021-10-20 MED ORDER — CYANOCOBALAMIN 1000 MCG/ML IJ SOLN
1000.0000 ug | Freq: Once | INTRAMUSCULAR | Status: AC
  Administered 2021-10-20: 1000 ug via INTRAMUSCULAR

## 2021-10-20 NOTE — Progress Notes (Signed)
Lauren Jones is a 56 y.o. female  presents to the office today for a B12 injection, per physician's orders. ?Original order: per Donato Schultz, DO ? ?cyanocobalamin, 1000 mg/ml IM was administered in the right deltoid today.  ? ?Patient tolerated injection well.  ? ?Next appointment:  11/17/21 ?

## 2021-11-07 ENCOUNTER — Telehealth: Payer: Self-pay | Admitting: Family Medicine

## 2021-11-07 NOTE — Telephone Encounter (Signed)
Pt states she received a message from the automatic system informed her that she's due for a colon screening and would like to get scheduled for her next one. Please advise  ?

## 2021-11-08 ENCOUNTER — Other Ambulatory Visit: Payer: Self-pay | Admitting: Family Medicine

## 2021-11-08 DIAGNOSIS — Z1211 Encounter for screening for malignant neoplasm of colon: Secondary | ICD-10-CM

## 2021-11-08 NOTE — Telephone Encounter (Signed)
Okay to place GI referral. 

## 2021-11-08 NOTE — Telephone Encounter (Signed)
noted 

## 2021-11-13 ENCOUNTER — Encounter: Payer: Self-pay | Admitting: Family Medicine

## 2021-11-13 ENCOUNTER — Telehealth (INDEPENDENT_AMBULATORY_CARE_PROVIDER_SITE_OTHER): Payer: BC Managed Care – PPO | Admitting: Family Medicine

## 2021-11-13 DIAGNOSIS — J0110 Acute frontal sinusitis, unspecified: Secondary | ICD-10-CM | POA: Insufficient documentation

## 2021-11-13 MED ORDER — AZELASTINE HCL 0.1 % NA SOLN: 1.0000 | Freq: Two times a day (BID) | NASAL | 12 refills | Status: AC

## 2021-11-13 MED ORDER — AZITHROMYCIN 250 MG PO TABS: ORAL_TABLET | ORAL | 0 refills | Status: DC

## 2021-11-13 MED ORDER — LORATADINE 10 MG PO TABS: 10.0000 mg | ORAL_TABLET | Freq: Every day | ORAL | 11 refills | Status: AC

## 2021-11-13 NOTE — Progress Notes (Signed)
MyChart Video Visit    Virtual Visit via Video Note   This visit type was conducted due to national recommendations for restrictions regarding the COVID-19 Pandemic (e.g. social distancing) in an effort to limit this patient's exposure and mitigate transmission in our community. This patient is at least at moderate risk for complications without adequate follow up. This format is felt to be most appropriate for this patient at this time. Physical exam was limited by quality of the video and audio technology used for the visit. Lauren Jones was able to get the patient set up on a video visit.  Patient location: home alone.    Patient and provider in visit Provider location: Office  I discussed the limitations of evaluation and management by telemedicine and the availability of in person appointments. The patient expressed understanding and agreed to proceed.  Visit Date: 11/13/2021  Today's healthcare provider: Donato Schultz, DO     Subjective:    Patient ID: Lauren Jones, Lauren Jones    DOB: 1965/10/11, 56 y.o.   MRN: [id number]  Chief Complaint  Patient presents with   Cough    HPI Patient is in today for nasal congestion and sinus pressure x 2 Lauren Jones.  Otc meds are not helping   neg covid test   + cough , productive   pt is having chils .  She has not taken her temp  Past Medical History:  Diagnosis Date   Hypothyroidism     Past Surgical History:  Procedure Laterality Date   THYROIDECTOMY Bilateral 2014-2015?    Family History  Problem Relation Age of Onset   Hypertension Mother    Hypertension Father    Sarcoidosis Father    Colon cancer Paternal Grandmother     Social History   Socioeconomic History   Marital status: Single    Spouse name: Not on file   Number of children: Not on file   Years of education: Not on file   Highest education level: Not on file  Occupational History   Not on file  Tobacco Use   Smoking status: Former    Types:  Cigarettes   Smokeless tobacco: Never  Substance and Sexual Activity   Alcohol use: Yes    Comment: social   Drug use: Never   Sexual activity: Not Currently  Other Topics Concern   Not on file  Social History Narrative   Not on file   Social Determinants of Health   Financial Resource Strain: Not on file  Food Insecurity: Not on file  Transportation Needs: Not on file  Physical Activity: Not on file  Stress: Not on file  Social Connections: Not on file  Intimate Partner Violence: Not on file    Outpatient Medications Prior to Visit  Medication Sig Dispense Refill   cyclobenzaprine (FLEXERIL) 10 MG tablet Take 1 tablet (10 mg total) by mouth daily. 30 tablet 1   fluticasone (FLONASE) 50 MCG/ACT nasal spray Place 2 sprays into both nostrils daily. 16 g 6   levothyroxine (SYNTHROID) 200 MCG tablet Take 1 tablet (200 mcg total) by mouth daily. Brand name only. 30 tablet 2   spironolactone (ALDACTONE) 25 MG tablet Take 1 tablet (25 mg total) by mouth daily. 90 tablet 3   Cholecalciferol (VITAMIN D3) 1000 units CAPS Take 1 capsule (1,000 Units total) by mouth daily. (Patient not taking: Reported on 11/29/2020) 90 capsule 1   No facility-administered medications prior to visit.    Allergies  Allergen Reactions  Bacitracin Itching   Furosemide Other (See Comments)    Headache   Latex Itching    Review of Systems  Constitutional:  Positive for chills and malaise/fatigue. Negative for fever.  HENT:  Positive for congestion and sinus pain.   Eyes:  Negative for blurred vision.  Respiratory:  Negative for cough and shortness of breath.   Cardiovascular:  Negative for chest pain, palpitations and leg swelling.  Gastrointestinal:  Negative for vomiting.  Musculoskeletal:  Negative for back pain.  Skin:  Negative for rash.  Neurological:  Negative for loss of consciousness and headaches.      Objective:    Physical Exam Vitals and nursing note reviewed.  Constitutional:       Appearance: Normal appearance.  Pulmonary:     Effort: Pulmonary effort is normal.  Neurological:     Mental Status: She is alert.  Psychiatric:        Mood and Affect: Mood normal.    There were no vitals taken for this visit. Wt Readings from Last 3 Encounters:  09/12/21 232 lb 6.4 oz (105.4 kg)  08/15/21 229 lb 12.8 oz (104.2 kg)  11/29/20 249 lb (112.9 kg)    Diabetic Foot Exam - Simple   No data filed    Lab Results  Component Value Date   WBC 10.1 09/26/2020   HGB 11.7 (L) 09/26/2020   HCT 35.3 (L) 09/26/2020   PLT 315.0 09/26/2020   GLUCOSE 90 09/26/2020   CHOL 188 12/22/2018   TRIG 137.0 12/22/2018   HDL 50.50 12/22/2018   LDLCALC 110 (H) 12/22/2018   ALT 17 09/26/2020   AST 16 09/26/2020   NA 139 09/26/2020   K 3.6 09/26/2020   CL 104 09/26/2020   CREATININE 1.06 09/26/2020   BUN 8 09/26/2020   CO2 28 09/26/2020   TSH 0.92 08/02/2021   HGBA1C 5.8 12/22/2018    Lab Results  Component Value Date   TSH 0.92 08/02/2021   Lab Results  Component Value Date   WBC 10.1 09/26/2020   HGB 11.7 (L) 09/26/2020   HCT 35.3 (L) 09/26/2020   MCV 79.3 09/26/2020   PLT 315.0 09/26/2020   Lab Results  Component Value Date   NA 139 09/26/2020   K 3.6 09/26/2020   CO2 28 09/26/2020   GLUCOSE 90 09/26/2020   BUN 8 09/26/2020   CREATININE 1.06 09/26/2020   BILITOT 0.5 09/26/2020   ALKPHOS 75 09/26/2020   AST 16 09/26/2020   ALT 17 09/26/2020   PROT 6.7 09/26/2020   ALBUMIN 4.2 09/26/2020   CALCIUM 9.3 09/26/2020   GFR 59.90 (L) 09/26/2020   Lab Results  Component Value Date   CHOL 188 12/22/2018   Lab Results  Component Value Date   HDL 50.50 12/22/2018   Lab Results  Component Value Date   LDLCALC 110 (H) 12/22/2018   Lab Results  Component Value Date   TRIG 137.0 12/22/2018   Lab Results  Component Value Date   CHOLHDL 4 12/22/2018   Lab Results  Component Value Date   HGBA1C 5.8 12/22/2018       Assessment & Plan:   Problem  List Items Addressed This Visit       Unprioritized   Acute non-recurrent frontal sinusitis - Primary    abx per orders  astelin ---  She does not like flonase  claritin  Call or rto prn        Relevant Medications   azelastine (ASTELIN) 0.1 %  nasal spray   azithromycin (ZITHROMAX Z-PAK) 250 MG tablet   loratadine (CLARITIN) 10 MG tablet    I am having Lauren Jones start on azelastine, azithromycin, and loratadine. I am also having her maintain her Vitamin D3, fluticasone, levothyroxine, cyclobenzaprine, and spironolactone.  Meds ordered this encounter  Medications   azelastine (ASTELIN) 0.1 % nasal spray    Sig: Place 1 spray into both nostrils 2 (two) times daily. Use in each nostril as directed    Dispense:  30 mL    Refill:  12   azithromycin (ZITHROMAX Z-PAK) 250 MG tablet    Sig: As directed    Dispense:  6 each    Refill:  0   loratadine (CLARITIN) 10 MG tablet    Sig: Take 1 tablet (10 mg total) by mouth daily.    Dispense:  30 tablet    Refill:  11    I discussed the assessment and treatment plan with the patient. The patient was provided an opportunity to ask questions and all were answered. The patient agreed with the plan and demonstrated an understanding of the instructions.   The patient was advised to call back or seek an in-person evaluation if the symptoms worsen or if the condition fails to improve as anticipated.    Lauren SchultzYvonne R Lowne Chase, DO [facility] [phone] (phone) [phone] (fax)  [facility]

## 2021-11-13 NOTE — Assessment & Plan Note (Signed)
abx per orders  astelin ---  She does not like flonase  claritin  Call or rto prn

## 2021-11-17 ENCOUNTER — Ambulatory Visit: Payer: BC Managed Care – PPO

## 2021-11-21 ENCOUNTER — Ambulatory Visit (INDEPENDENT_AMBULATORY_CARE_PROVIDER_SITE_OTHER): Payer: BC Managed Care – PPO

## 2021-11-21 ENCOUNTER — Other Ambulatory Visit: Payer: Self-pay

## 2021-11-21 DIAGNOSIS — E538 Deficiency of other specified B group vitamins: Secondary | ICD-10-CM

## 2021-11-21 MED ORDER — CYANOCOBALAMIN 1000 MCG/ML IJ SOLN
1000.0000 ug | Freq: Once | INTRAMUSCULAR | Status: AC
  Administered 2021-11-21: 1000 ug via INTRAMUSCULAR

## 2021-11-21 NOTE — Progress Notes (Signed)
Lauren Jones is a 56 y.o. female presents to the office today for a B12 injection, per physician's orders. Original order: per Zola Button, Grayling Congress, DO  cyanocobalamin, 1000 mg/ml IM was administered in the left deltoid today.   Patient tolerated injection well.

## 2021-11-26 ENCOUNTER — Other Ambulatory Visit: Payer: Self-pay | Admitting: Family Medicine

## 2021-11-26 DIAGNOSIS — R600 Localized edema: Secondary | ICD-10-CM

## 2021-11-26 DIAGNOSIS — I10 Essential (primary) hypertension: Secondary | ICD-10-CM

## 2021-12-20 ENCOUNTER — Ambulatory Visit: Payer: BC Managed Care – PPO

## 2022-04-10 ENCOUNTER — Other Ambulatory Visit: Payer: Self-pay

## 2022-04-10 ENCOUNTER — Other Ambulatory Visit (INDEPENDENT_AMBULATORY_CARE_PROVIDER_SITE_OTHER): Payer: PRIVATE HEALTH INSURANCE

## 2022-04-10 ENCOUNTER — Telehealth: Payer: Self-pay | Admitting: Family Medicine

## 2022-04-10 ENCOUNTER — Ambulatory Visit: Payer: PRIVATE HEALTH INSURANCE | Admitting: Family Medicine

## 2022-04-10 DIAGNOSIS — Z0000 Encounter for general adult medical examination without abnormal findings: Secondary | ICD-10-CM | POA: Diagnosis not present

## 2022-04-10 DIAGNOSIS — I10 Essential (primary) hypertension: Secondary | ICD-10-CM

## 2022-04-10 DIAGNOSIS — E039 Hypothyroidism, unspecified: Secondary | ICD-10-CM

## 2022-04-10 NOTE — Telephone Encounter (Signed)
Patient came in late to her physical appointment with Dr. Etter Sjogren (at 3:21pm), and she stated she called and let us know that she was running late since she was stuck in a surgery. Patient was advised that there was a 10 minute grace period and after that, it would depends on the Provider's availability. Per PCP she could stay and wait if someone no showed but there was no guarantee she would be seen today. Patient was later approached and advised that appointment would have to be reschedule due to the provider not having enough time to do a cpe due to her full schedule. Patient was offered to go ahead and do labs and come back for a physical but pt refused due to her not being able to take off work and needing her physical done before a certain date in order to keep her premium down. Explained to patient that Dr. Etter Sjogren would not be able to do it before the date she requested but I could check if the PA Percell Miller could do it Monday at 1pm. Patient stated it had to be done after 4 since she had to work. Explained to patient that physical could be done as late as 3 but could be done later on Tuesdays but it would be after the date requested. While looking over the schedule to see how else the pt could be accommodated, the patient asked again if Dr. Etter Sjogren could see her today since she had left work early already. It was explained again that Dr. Etter Sjogren would not be able to see her to which the patient stated "forget about it, I'm going to find a new provider" and left. Patient later came back in and asked if she could still do her labs, which she was then added to the lab schedule and checked in. Minutes later patient walked out of the lab and asked for her appointment to be cancelled and that she would find a new provider. Patient left right after.

## 2022-04-13 ENCOUNTER — Telehealth: Payer: PRIVATE HEALTH INSURANCE | Admitting: Family Medicine

## 2022-04-18 NOTE — Progress Notes (Signed)
Pt left without being seen.

## 2022-07-04 ENCOUNTER — Other Ambulatory Visit: Payer: Self-pay | Admitting: *Deleted

## 2022-07-04 DIAGNOSIS — R600 Localized edema: Secondary | ICD-10-CM

## 2022-07-04 DIAGNOSIS — I10 Essential (primary) hypertension: Secondary | ICD-10-CM

## 2022-07-04 MED ORDER — HYDROCHLOROTHIAZIDE 25 MG PO TABS: ORAL_TABLET | ORAL | 0 refills | Status: DC

## 2022-07-12 ENCOUNTER — Other Ambulatory Visit: Payer: Self-pay

## 2022-07-12 DIAGNOSIS — R600 Localized edema: Secondary | ICD-10-CM

## 2022-07-12 DIAGNOSIS — I10 Essential (primary) hypertension: Secondary | ICD-10-CM

## 2022-07-12 MED ORDER — HYDROCHLOROTHIAZIDE 25 MG PO TABS: ORAL_TABLET | ORAL | 0 refills | Status: DC

## 2023-03-28 ENCOUNTER — Other Ambulatory Visit: Payer: Self-pay | Admitting: Family Medicine

## 2023-03-28 DIAGNOSIS — I10 Essential (primary) hypertension: Secondary | ICD-10-CM

## 2023-03-28 DIAGNOSIS — R600 Localized edema: Secondary | ICD-10-CM

## 2023-03-29 ENCOUNTER — Other Ambulatory Visit: Payer: Self-pay | Admitting: Family Medicine

## 2023-03-29 DIAGNOSIS — Z1211 Encounter for screening for malignant neoplasm of colon: Secondary | ICD-10-CM

## 2023-03-29 DIAGNOSIS — Z1212 Encounter for screening for malignant neoplasm of rectum: Secondary | ICD-10-CM

## 2023-03-31 ENCOUNTER — Other Ambulatory Visit: Payer: Self-pay | Admitting: Family Medicine

## 2023-03-31 DIAGNOSIS — I10 Essential (primary) hypertension: Secondary | ICD-10-CM

## 2023-03-31 DIAGNOSIS — R600 Localized edema: Secondary | ICD-10-CM

## 2023-07-22 ENCOUNTER — Other Ambulatory Visit: Payer: Self-pay | Admitting: Family Medicine

## 2023-07-22 DIAGNOSIS — R600 Localized edema: Secondary | ICD-10-CM

## 2023-07-22 DIAGNOSIS — I10 Essential (primary) hypertension: Secondary | ICD-10-CM

## 2023-09-26 ENCOUNTER — Other Ambulatory Visit: Payer: Self-pay | Admitting: Family Medicine

## 2023-09-26 DIAGNOSIS — I10 Essential (primary) hypertension: Secondary | ICD-10-CM

## 2023-09-26 DIAGNOSIS — R600 Localized edema: Secondary | ICD-10-CM

## 2023-09-27 ENCOUNTER — Other Ambulatory Visit: Payer: Self-pay | Admitting: Family Medicine

## 2023-09-27 DIAGNOSIS — I10 Essential (primary) hypertension: Secondary | ICD-10-CM

## 2023-09-27 DIAGNOSIS — R600 Localized edema: Secondary | ICD-10-CM

## 2023-09-27 NOTE — Telephone Encounter (Signed)
 Pt is asking that Rx be sent to [facility] on [address] since it's getting late in the day and her main pharmacy closes at 5pm.

## 2024-01-10 ENCOUNTER — Encounter: Payer: Self-pay | Admitting: Advanced Practice Midwife

## 2024-01-10 ENCOUNTER — Other Ambulatory Visit: Payer: Self-pay | Admitting: Family Medicine

## 2024-01-10 DIAGNOSIS — I10 Essential (primary) hypertension: Secondary | ICD-10-CM

## 2024-01-10 DIAGNOSIS — R600 Localized edema: Secondary | ICD-10-CM

## 2024-03-31 ENCOUNTER — Other Ambulatory Visit (HOSPITAL_BASED_OUTPATIENT_CLINIC_OR_DEPARTMENT_OTHER): Payer: Self-pay

## 2024-03-31 MED ORDER — FLUZONE 0.5 ML IM SUSY
0.5000 mL | PREFILLED_SYRINGE | Freq: Once | INTRAMUSCULAR | 0 refills | Status: AC
  Filled 2024-03-31: qty 0.5, 1d supply, fill #0

## 2024-05-01 ENCOUNTER — Telehealth: Payer: Self-pay | Admitting: Family Medicine

## 2024-05-01 ENCOUNTER — Other Ambulatory Visit: Payer: Self-pay | Admitting: Family Medicine

## 2024-05-01 DIAGNOSIS — I10 Essential (primary) hypertension: Secondary | ICD-10-CM

## 2024-05-01 DIAGNOSIS — R600 Localized edema: Secondary | ICD-10-CM

## 2024-05-01 MED ORDER — HYDROCHLOROTHIAZIDE 25 MG PO TABS: 25.0000 mg | ORAL_TABLET | Freq: Every day | ORAL | 1 refills | Status: DC

## 2024-05-01 NOTE — Telephone Encounter (Signed)
 Lauren Jones- please inform Pt that refill has been sent, however will need appt for further refills. Please schedule. Thank you.

## 2024-05-01 NOTE — Telephone Encounter (Signed)
I got her scheduled

## 2024-05-01 NOTE — Telephone Encounter (Signed)
Last OV 03/2022

## 2024-05-01 NOTE — Telephone Encounter (Signed)
 Copied from CRM [id number]. Topic: Clinical - Medication Question >> May 01, 2024  2:40 PM Franky GRADE wrote: Reason for CRM: Patient is calling because she was informed that her hydroCHLOROthiazide  25 mg Oral Daily was denied for refill, I advised that it was because she had not seen the provider in a long time. She explained that she has been without insurance due to lack of employment and could not afford coming in for an appointment. She would like to know if there is any way to assist with a refill or anyway she can see the provider and pay for the visit monthly.

## 2024-05-07 ENCOUNTER — Encounter: Payer: Self-pay | Admitting: Family Medicine

## 2024-05-07 ENCOUNTER — Ambulatory Visit (INDEPENDENT_AMBULATORY_CARE_PROVIDER_SITE_OTHER): Payer: PRIVATE HEALTH INSURANCE | Admitting: Family Medicine

## 2024-05-07 VITALS — BP 118/70 | HR 62 | Temp 98.1°F | Resp 18 | Ht 65.0 in | Wt 235.6 lb

## 2024-05-07 DIAGNOSIS — R600 Localized edema: Secondary | ICD-10-CM

## 2024-05-07 DIAGNOSIS — I10 Essential (primary) hypertension: Secondary | ICD-10-CM

## 2024-05-07 DIAGNOSIS — N760 Acute vaginitis: Secondary | ICD-10-CM

## 2024-05-07 DIAGNOSIS — J0140 Acute pansinusitis, unspecified: Secondary | ICD-10-CM

## 2024-05-07 DIAGNOSIS — M62838 Other muscle spasm: Secondary | ICD-10-CM

## 2024-05-07 MED ORDER — CYCLOBENZAPRINE HCL 10 MG PO TABS: 10.0000 mg | ORAL_TABLET | Freq: Every day | ORAL | 1 refills | Status: AC

## 2024-05-07 MED ORDER — BENZONATATE 200 MG PO CAPS: 200.0000 mg | ORAL_CAPSULE | Freq: Two times a day (BID) | ORAL | 0 refills | Status: AC | PRN

## 2024-05-07 MED ORDER — AMOXICILLIN-POT CLAVULANATE 875-125 MG PO TABS: 1.0000 | ORAL_TABLET | Freq: Two times a day (BID) | ORAL | 0 refills | Status: AC

## 2024-05-07 MED ORDER — HYDROCHLOROTHIAZIDE 25 MG PO TABS: 25.0000 mg | ORAL_TABLET | Freq: Every day | ORAL | 3 refills | Status: AC

## 2024-05-07 MED ORDER — FLUCONAZOLE 150 MG PO TABS: 150.0000 mg | ORAL_TABLET | Freq: Every day | ORAL | 0 refills | Status: AC

## 2024-05-07 NOTE — Progress Notes (Signed)
 Subjective:    Patient ID: Lauren Jones, female    DOB: 1965/11/23, 58 y.o.   MRN: [id number]  Chief Complaint  Patient presents with   Hypertension   Hypothyroidism   Follow-up   Sinus Problem    X2 [place], pt states otc meds, sinus pressure, congestion, pt states taking sudafed.     HPI Patient is in today for sinusitis.  Discussed the use of AI scribe software for clinical note transcription with the patient, who gave verbal consent to proceed.  History of Present Illness Lauren Jones is a 58 year old female who presents with severe nasal congestion and sinus pressure.  She has experienced severe nasal congestion and sinus pressure for the past three [place], initially accompanied by coughing, sneezing, and headaches, which have now progressed to nasal drainage. Symptoms are worse during the day, although she was able to sleep last night after taking Sudafed. She avoids nasal sprays due to irritation and burning. She has not performed a COVID or flu test but has received a flu shot. She experiences nighttime fevers, feeling hot and then cold, but does not have a thermometer to check her temperature.  She is currently taking Sudafed for congestion and Tessalon  Pearls for her cough. She requests a new prescription for Tessalon  Pearls and Flexeril . She was recently diagnosed with diabetes and is on a GLP medication, which she takes every other week due to lack of insurance. She also takes 250 mcg of levothyroxine  and blood pressure medication.  She is currently unemployed and seeking a job. 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 She is studying for her state boards to become nationally certified as a tefl teacher, which she believes will improve her job prospects. She is also a agricultural engineer.  She has a history of yeast infections, particularly after taking high doses of antibiotics for COVID treatment. She experienced  a rash on her breasts, which was treated with a cream prescribed by a dermatologist, clearing up the rash and restoring normal skin color. She suspects the rash may have been exacerbated by chemicals used in cleaning surgical uniforms.    Past Medical History:  Diagnosis Date   Hypothyroidism     Past Surgical History:  Procedure Laterality Date   THYROIDECTOMY Bilateral 2014-2015?    Family History  Problem Relation Age of Onset   Hypertension Mother    Hypertension Father    Sarcoidosis Father    Colon cancer Paternal Grandmother     Social History   Socioeconomic History   Marital status: Single    Spouse name: Not on file   Number of children: Not on file   Years of education: Not on file   Highest education level: Not on file  Occupational History   Not on file  Tobacco Use   Smoking status: Former    Types: Cigarettes   Smokeless tobacco: Never  Substance and Sexual Activity   Alcohol use: Yes    Comment: social   Drug use: Never   Sexual activity: Not Currently  Other Topics Concern   Not on file  Social History Narrative   Not on file   Social Drivers of Health   Financial Resource Strain: Not on file  Food Insecurity: Not on file  Transportation Needs: Not on file  Physical Activity: Not on file  Stress: Not on  file  Social Connections: Not on file  Intimate Partner Violence: Not on file    Outpatient Medications Prior to Visit  Medication Sig Dispense Refill   azelastine  (ASTELIN ) 0.1 % nasal spray Place 1 spray into both nostrils 2 (two) times daily. Use in each nostril as directed 30 mL 12   fluticasone  (FLONASE ) 50 MCG/ACT nasal spray Place 2 sprays into both nostrils daily. 16 g 6   levothyroxine  (SYNTHROID ) 200 MCG tablet Take 1 tablet (200 mcg total) by mouth daily. Brand name only. 30 tablet 2   loratadine  (CLARITIN ) 10 MG tablet Take 1 tablet (10 mg total) by mouth daily. 30 tablet 11   tirzepatide (MOUNJARO) 10 MG/0.5ML Pen Inject 10  mg into the skin.     Vitamin D -Vitamin K (VITAMIN K2-VITAMIN D3 PO) Take by mouth.     azithromycin  (ZITHROMAX  Z-PAK) 250 MG tablet As directed 6 each 0   cyclobenzaprine  (FLEXERIL ) 10 MG tablet Take 1 tablet (10 mg total) by mouth daily. 30 tablet 1   hydrochlorothiazide  (HYDRODIURIL ) 25 MG tablet Take 1 tablet (25 mg total) by mouth daily. 90 tablet 1   Cholecalciferol (VITAMIN D3) 1000 units CAPS Take 1 capsule (1,000 Units total) by mouth daily. (Patient not taking: Reported on 05/07/2024) 90 capsule 1   spironolactone  (ALDACTONE ) 25 MG tablet Take 1 tablet (25 mg total) by mouth daily. (Patient not taking: Reported on 05/07/2024) 90 tablet 3   No facility-administered medications prior to visit.    Allergies  Allergen Reactions   Bacitracin Itching   Furosemide Other (See Comments)    Headache   Latex Itching    Review of Systems  Constitutional:  Negative for fever and malaise/fatigue.  HENT:  Positive for congestion and sinus pain.   Eyes:  Negative for blurred vision.  Respiratory:  Negative for cough and shortness of breath.   Cardiovascular:  Negative for chest pain, palpitations and leg swelling.  Gastrointestinal:  Negative for abdominal pain, blood in stool and nausea.  Genitourinary:  Negative for dysuria and frequency.  Musculoskeletal:  Negative for falls.  Skin:  Negative for rash.  Neurological:  Negative for dizziness, loss of consciousness and headaches.  Endo/Heme/Allergies:  Negative for environmental allergies.  Psychiatric/Behavioral:  Negative for depression. The patient is not nervous/anxious.        Objective:    Physical Exam Vitals and nursing note reviewed.  Constitutional:      General: She is not in acute distress.    Appearance: Normal appearance. She is well-developed.  HENT:     Head: Normocephalic and atraumatic.     Nose: Congestion and rhinorrhea present.     Right Sinus: Maxillary sinus tenderness and frontal sinus tenderness  present.     Left Sinus: Maxillary sinus tenderness and frontal sinus tenderness present.  Eyes:     General: No scleral icterus.       Right eye: No discharge.        Left eye: No discharge.  Cardiovascular:     Rate and Rhythm: Normal rate and regular rhythm.     Heart sounds: No murmur heard. Pulmonary:     Effort: Pulmonary effort is normal. No respiratory distress.     Breath sounds: Normal breath sounds.  Musculoskeletal:        General: Normal range of motion.     Cervical back: Normal range of motion and neck supple.     Right lower leg: No edema.     Left lower leg:  No edema.  Skin:    General: Skin is warm and dry.  Neurological:     Mental Status: She is alert and oriented to person, place, and time.  Psychiatric:        Mood and Affect: Mood normal.        Behavior: Behavior normal.        Thought Content: Thought content normal.        Judgment: Judgment normal.     BP 118/70 (BP Location: Left Arm, Patient Position: Sitting, Cuff Size: Large)   Pulse 62   Temp 98.1 F (36.7 C) (Oral)   Resp 18   Ht 5' 5 (1.651 m)   Wt 235 lb 9.6 oz (106.9 kg)   SpO2 96%   BMI 39.21 kg/m  Wt Readings from Last 3 Encounters:  05/07/24 235 lb 9.6 oz (106.9 kg)  09/12/21 232 lb 6.4 oz (105.4 kg)  08/15/21 229 lb 12.8 oz (104.2 kg)    Diabetic Foot Exam - Simple   No data filed    Lab Results  Component Value Date   WBC 10.1 09/26/2020   HGB 11.7 (L) 09/26/2020   HCT 35.3 (L) 09/26/2020   PLT 315.0 09/26/2020   GLUCOSE 90 09/26/2020   CHOL 188 12/22/2018   TRIG 137.0 12/22/2018   HDL 50.50 12/22/2018   LDLCALC 110 (H) 12/22/2018   ALT 17 09/26/2020   AST 16 09/26/2020   NA 139 09/26/2020   K 3.6 09/26/2020   CL 104 09/26/2020   CREATININE 1.06 09/26/2020   BUN 8 09/26/2020   CO2 28 09/26/2020   TSH 0.92 08/02/2021   HGBA1C 5.8 12/22/2018    Lab Results  Component Value Date   TSH 0.92 08/02/2021   Lab Results  Component Value Date   WBC 10.1  09/26/2020   HGB 11.7 (L) 09/26/2020   HCT 35.3 (L) 09/26/2020   MCV 79.3 09/26/2020   PLT 315.0 09/26/2020   Lab Results  Component Value Date   NA 139 09/26/2020   K 3.6 09/26/2020   CO2 28 09/26/2020   GLUCOSE 90 09/26/2020   BUN 8 09/26/2020   CREATININE 1.06 09/26/2020   BILITOT 0.5 09/26/2020   ALKPHOS 75 09/26/2020   AST 16 09/26/2020   ALT 17 09/26/2020   PROT 6.7 09/26/2020   ALBUMIN 4.2 09/26/2020   CALCIUM 9.3 09/26/2020   GFR 59.90 (L) 09/26/2020   Lab Results  Component Value Date   CHOL 188 12/22/2018   Lab Results  Component Value Date   HDL 50.50 12/22/2018   Lab Results  Component Value Date   LDLCALC 110 (H) 12/22/2018   Lab Results  Component Value Date   TRIG 137.0 12/22/2018   Lab Results  Component Value Date   CHOLHDL 4 12/22/2018   Lab Results  Component Value Date   HGBA1C 5.8 12/22/2018       Assessment & Plan:  Acute non-recurrent pansinusitis -     Amoxicillin-Pot Clavulanate; Take 1 tablet by mouth 2 (two) times daily.  Dispense: 20 tablet; Refill: 0 -     Benzonatate ; Take 1 capsule (200 mg total) by mouth 2 (two) times daily as needed for cough.  Dispense: 20 capsule; Refill: 0  Muscle spasm -     Cyclobenzaprine  HCl; Take 1 tablet (10 mg total) by mouth daily.  Dispense: 30 tablet; Refill: 1  Acute vaginitis -     Fluconazole; Take 1 tablet (150 mg total) by mouth daily. May  repeat in 3 [place] if needed.  Dispense: 2 tablet; Refill: 0  Lower extremity edema -     hydroCHLOROthiazide ; Take 1 tablet (25 mg total) by mouth daily.  Dispense: 90 tablet; Refill: 3  Essential hypertension -     hydroCHLOROthiazide ; Take 1 tablet (25 mg total) by mouth daily.  Dispense: 90 tablet; Refill: 3  Assessment and Plan Assessment & Plan Acute pansinusitis   She has experienced congestion, sinus pressure, and nasal drainage for three [place], with nighttime fever and daytime congestion. No prior COVID or flu testing has been done. Nasal  sprays worsen her symptoms, while decongestants like Sudafed provide temporary relief but are not recommended due to potential blood pressure elevation. Prescribe an antibiotic for sinusitis. Advise against regular use of decongestants. Recommend Mucinex for mucus relief and suggest using a cool mist diffuser with eucalyptus or Vicks for symptomatic relief.  Muscle spasm   Prescribe Flexeril .  Type 2 diabetes mellitus   Recently diagnosed with type 2 diabetes mellitus. She is on GLP-1 agonist therapy but has a limited supply due to lack of insurance. Advise taking medication every other week until insurance is obtained. Continue GLP-1 agonist therapy as per current supply.  Essential hypertension   Her hypertension is managed with medication. Decongestants like Sudafed can elevate blood pressure, but she reports no issues with current use. Advise against regular use of decongestants.  Hypothyroidism   Her hypothyroidism is managed with levothyroxine . Continue levothyroxine  therapy.    Lauren Deutschman R Lowne Chase, DO
